# Patient Record
Sex: Female | Born: 1970
Health system: Southern US, Community
[De-identification: ages and names within clinical notes are randomized; demographics above are authoritative.]

## PROBLEM LIST (undated history)

## (undated) DIAGNOSIS — E049 Nontoxic goiter, unspecified: Secondary | ICD-10-CM

## (undated) DIAGNOSIS — I10 Essential (primary) hypertension: Secondary | ICD-10-CM

## (undated) HISTORY — DX: Essential (primary) hypertension: I10

## (undated) HISTORY — DX: Nontoxic goiter, unspecified: E04.9

## (undated) HISTORY — PX: NO PAST SURGERIES: SHX2092

---

## 2009-01-19 ENCOUNTER — Encounter: Admission: RE | Admit: 2009-01-19 | Discharge: 2009-01-19 | Payer: Self-pay | Admitting: Critical Care Medicine

## 2009-02-04 ENCOUNTER — Inpatient Hospital Stay (HOSPITAL_COMMUNITY): Admission: AD | Admit: 2009-02-04 | Discharge: 2009-02-07 | Payer: Self-pay | Admitting: Obstetrics and Gynecology

## 2010-09-24 LAB — CBC
HCT: 29.1 % — ABNORMAL LOW (ref 36.0–46.0)
Hemoglobin: 8.4 g/dL — ABNORMAL LOW (ref 12.0–15.0)
Hemoglobin: 9.6 g/dL — ABNORMAL LOW (ref 12.0–15.0)
Hemoglobin: 9.9 g/dL — ABNORMAL LOW (ref 12.0–15.0)
MCHC: 33.1 g/dL (ref 30.0–36.0)
MCHC: 33.2 g/dL (ref 30.0–36.0)
MCHC: 33.2 g/dL (ref 30.0–36.0)
MCV: 79.3 fL (ref 78.0–100.0)
Platelets: 265 10*3/uL (ref 150–400)
RBC: 3.19 MIL/uL — ABNORMAL LOW (ref 3.87–5.11)
RDW: 19.2 % — ABNORMAL HIGH (ref 11.5–15.5)
RDW: 19.4 % — ABNORMAL HIGH (ref 11.5–15.5)
WBC: 10.5 10*3/uL (ref 4.0–10.5)
WBC: 9 10*3/uL (ref 4.0–10.5)

## 2010-09-24 LAB — COMPREHENSIVE METABOLIC PANEL
ALT: 13 U/L (ref 0–35)
ALT: 15 U/L (ref 0–35)
Albumin: 2.5 g/dL — ABNORMAL LOW (ref 3.5–5.2)
Alkaline Phosphatase: 83 U/L (ref 39–117)
Alkaline Phosphatase: 96 U/L (ref 39–117)
BUN: 8 mg/dL (ref 6–23)
CO2: 24 mEq/L (ref 19–32)
Calcium: 8.7 mg/dL (ref 8.4–10.5)
Chloride: 106 mEq/L (ref 96–112)
Creatinine, Ser: 0.67 mg/dL (ref 0.4–1.2)
Creatinine, Ser: 0.71 mg/dL (ref 0.4–1.2)
GFR calc non Af Amer: >60 mL/min (ref >60)
Glucose, Bld: 74 mg/dL (ref 70–99)
Glucose, Bld: 90 mg/dL (ref 70–99)
Glucose, Bld: 96 mg/dL (ref 70–99)
Potassium: 3.6 mEq/L (ref 3.5–5.1)
Potassium: 3.7 mEq/L (ref 3.5–5.1)
Sodium: 135 mEq/L (ref 135–145)
Sodium: 136 mEq/L (ref 135–145)
Total Bilirubin: 0.1 mg/dL — ABNORMAL LOW (ref 0.3–1.2)
Total Protein: 6.4 g/dL (ref 6.0–8.3)
Total Protein: 6.7 g/dL (ref 6.0–8.3)

## 2010-09-24 LAB — URIC ACID
Uric Acid, Serum: 6.1 mg/dL (ref 2.4–7.0)
Uric Acid, Serum: 6.4 mg/dL (ref 2.4–7.0)

## 2010-09-24 LAB — LACTATE DEHYDROGENASE: LDH: 101 U/L (ref 94–250)

## 2010-09-24 LAB — MAGNESIUM: Magnesium: 4.9 mg/dL — ABNORMAL HIGH (ref 1.5–2.5)

## 2011-09-26 ENCOUNTER — Other Ambulatory Visit: Payer: Self-pay | Admitting: Family Medicine

## 2011-09-26 DIAGNOSIS — E049 Nontoxic goiter, unspecified: Secondary | ICD-10-CM

## 2011-10-04 ENCOUNTER — Ambulatory Visit
Admission: RE | Admit: 2011-10-04 | Discharge: 2011-10-04 | Disposition: A | Discharge status: Home / Self Care | Payer: BC Managed Care – PPO | Source: Ambulatory Visit | Attending: Family Medicine | Admitting: Family Medicine

## 2011-10-04 DIAGNOSIS — E049 Nontoxic goiter, unspecified: Secondary | ICD-10-CM

## 2013-01-02 ENCOUNTER — Other Ambulatory Visit: Payer: Self-pay | Admitting: Family Medicine

## 2013-01-02 DIAGNOSIS — E079 Disorder of thyroid, unspecified: Secondary | ICD-10-CM

## 2013-01-03 ENCOUNTER — Ambulatory Visit
Admission: RE | Admit: 2013-01-03 | Discharge: 2013-01-03 | Disposition: A | Discharge status: Home / Self Care | Payer: BC Managed Care – PPO | Source: Ambulatory Visit | Attending: Family Medicine | Admitting: Family Medicine

## 2013-01-03 DIAGNOSIS — E079 Disorder of thyroid, unspecified: Secondary | ICD-10-CM

## 2013-01-28 ENCOUNTER — Ambulatory Visit (INDEPENDENT_AMBULATORY_CARE_PROVIDER_SITE_OTHER): Payer: BC Managed Care – PPO | Admitting: Surgery

## 2013-01-28 ENCOUNTER — Encounter (INDEPENDENT_AMBULATORY_CARE_PROVIDER_SITE_OTHER): Payer: Self-pay | Admitting: Surgery

## 2013-01-28 VITALS — BP 122/78 | HR 72 | Resp 14 | Ht 66.0 in | Wt 219.0 lb

## 2013-01-28 DIAGNOSIS — E042 Nontoxic multinodular goiter: Secondary | ICD-10-CM | POA: Insufficient documentation

## 2013-01-28 DIAGNOSIS — D449 Neoplasm of uncertain behavior of unspecified endocrine gland: Secondary | ICD-10-CM

## 2013-01-28 DIAGNOSIS — D440 Neoplasm of uncertain behavior of thyroid gland: Secondary | ICD-10-CM | POA: Insufficient documentation

## 2013-01-28 NOTE — Progress Notes (Signed)
General Surgery [facility]  Chief Complaint  Patient presents with  . New Evaluation    thyroid nodules - referral from Dr. Vianne Bulls    HISTORY: The patient is a 42 year old female referred by her primary care physician for evaluation of bilateral thyroid nodules. Patient was diagnosed with multinodular goiter one year ago. She underwent ultrasound in April of 2013. On physical examination this year she was again noted to have prominent bilateral thyroid nodules. Ultrasound performed on 01/03/2013 is reviewed with the patient. This shows multiple thyroid nodules arising in a small multinodular goiter. Dominant nodule on the right measures 17 mm. Dominant nodule in the left measures 20 mm and has microcalcifications. Patient is referred to surgery for further evaluation and recommendations for management.  Patient's TSH level was normal at 1.45.  Patient has no prior history of head or neck surgery. She has never been on thyroid medication. There is a history of Graves' disease and the patient's sister. Patient's mother is being treated for a laryngeal neoplasm.  Past Medical History  Diagnosis Date  . Hypertension     Current Outpatient Prescriptions  Medication Sig Dispense Refill  . amLODipine (NORVASC) 5 MG tablet       . b complex vitamins tablet Take 1 tablet by mouth daily.      . hydrochlorothiazide (HYDRODIURIL) 25 MG tablet       . Multiple Vitamins-Minerals (MULTIVITAMIN WITH MINERALS) tablet Take 1 tablet by mouth daily.       No current facility-administered medications for this visit.    No Known Allergies  History reviewed. No pertinent family history.  History   Social History  . Marital Status: Single    Spouse Name: N/A    Number of Children: N/A  . Years of Education: N/A   Social History Main Topics  . Smoking status: Never Smoker   . Smokeless tobacco: Never Used  . Alcohol Use: Yes  . Drug Use: No  . Sexually Active: None    Other Topics Concern  . None   Social History Narrative  . None    REVIEW OF SYSTEMS - PERTINENT POSITIVES ONLY: Denies fatigue. Denies tremor. Denies palpitations. Denies compressive symptoms. Some success with weight loss with exercise.  EXAM: Filed Vitals:   01/28/13 0859  BP: 122/78  Pulse: 72  Resp: 14    HEENT: normocephalic; pupils equal and reactive; sclerae clear; dentition good; mucous membranes moist NECK:  Palpable bilateral thyroid nodules with dominant mass in right inferior pole measuring approximately 2 cm, firm, mobile, nontender; asymmetric on extension; no palpable anterior or posterior cervical lymphadenopathy; no supraclavicular masses; no tenderness CHEST: clear to auscultation bilaterally without rales, rhonchi, or wheezes CARDIAC: regular rate and rhythm without significant murmur; peripheral pulses are full EXT:  non-tender without edema; no deformity NEURO: no gross focal deficits; no sign of tremor   LABORATORY RESULTS: See [facility] (CHL-Epic) for most recent results  RADIOLOGY RESULTS: See [facility] (CHL-Epic) for most recent results  IMPRESSION: Multinodular thyroid goiter with bilateral dominant thyroid nodules  PLAN: Patient requires bilateral fine-needle aspiration biopsies. We will make arrangements for the study to be performed in the near future. I will contact her with the results.  If the cytopathology is benign, I have recommended that the patient return in 6 months for short interval followup with repeat ultrasound and physical examination. If the cytopathology shows atypia or possible malignancy, then I believe the patient will require total thyroidectomy.  She and I discussed these potential options today. I provided her with written literature to review. We will contact her with her biopsy results.  Velora Heckler, MD, FACS General & Endocrine Surgery [facility]  Primary Care  Physician: Daisy Floro, MD

## 2013-01-28 NOTE — Patient Instructions (Signed)
Thyroid Diseases  Your thyroid is a butterfly-shaped gland in your neck. It is located just above your collarbone. It is one of your endocrine glands, which make hormones. The thyroid helps set your metabolism. Metabolism is how your body gets energy from the foods you eat.   Millions of people have thyroid diseases. Women experience thyroid problems more often than men. In fact, overactive thyroid problems (hyperthyroidism) occur in 1% of all women. If you have a thyroid disease, your body may use energy more slowly or quickly than it should.   Thyroid problems also include an immune disease where your body reacts against your thyroid gland (called thyroiditis). A different problem involves lumps and bumps (called nodules) that develop in the gland. The nodules are usually, but not always, noncancerous.  THE MOST COMMON THYROID PROBLEMS AND CAUSES ARE DISCUSSED BELOW  There are many causes for thyroid problems. Treatment depends upon the exact diagnosis and includes trying to reset your body's metabolism to a normal rate.  Hyperthyroidism  Too much thyroid hormone from an overactive thyroid gland is called hyperthyroidism. In hyperthyroidism, the body's metabolism speeds up. One of the most frequent forms of hyperthyroidism is known as Graves' disease. Graves' disease tends to run in families. Although Graves' is thought to be caused by a problem with the immune system, the exact nature of the genetic problem is unknown.  Hypothyroidism  Too little thyroid hormone from an underactive thyroid gland is called hypothyroidism. In hypothyroidism, the body's metabolism is slowed. Several things can cause this condition. Most causes affect the thyroid gland directly and hurt its ability to make enough hormone.   Rarely, there may be a pituitary gland tumor (located near the base of the brain). The tumor can block the pituitary from producing thyroid-stimulating hormone (TSH). Your body makes TSH to stimulate the thyroid  to work properly. If the pituitary does not make enough TSH, the thyroid fails to make enough hormones needed for good health.  Whether the problem is caused by thyroid conditions or by the pituitary gland, the result is that the thyroid is not making enough hormones. Hypothyroidism causes many physical and mental processes to become sluggish. The body consumes less oxygen and produces less body heat.  Thyroid Nodules  A thyroid nodule is a small swelling or lump in the thyroid gland. They are common. These nodules represent either a growth of thyroid tissue or a fluid-filled cyst. Both form a lump in the thyroid gland. Almost half of all people will have tiny thyroid nodules at some point in their lives. Typically, these are not noticeable until they become large and affect normal thyroid size. Larger nodules that are greater than a half inch across (about 1 centimeter) occur in about 5 percent of people.  Although most nodules are not cancerous, people who have them should seek medical care to rule out cancer. Also, some thyroid nodules may produce too much thyroid hormone or become too large. Large nodules or a large gland can interfere with breathing or swallowing or may cause neck discomfort.  Other problems  Other thyroid problems include cancer and thyroiditis. Thyroiditis is a malfunction of the body's immune system. Normally, the immune system works to defend the body against infection and other problems. When the immune system is not working properly, it may mistakenly attack normal cells, tissues, and organs. Examples of autoimmune diseases are Hashimoto's thyroiditis (which causes low thyroid function) and Graves' disease (which causes excess thyroid function).  SYMPTOMS   Symptoms   vary greatly depending upon the exact type of problem with the thyroid.  Hyperthyroidism-is when your thyroid is too active and makes more thyroid hormone than your body needs. The most common cause is Graves' Disease. Too  much thyroid hormone can cause some or all of the following symptoms:  · Anxiety.  · Irritability.  · Difficulty sleeping.  · Fatigue.  · A rapid or irregular heartbeat.  · A fine tremor of your hands or fingers.  · An increase in perspiration.  · Sensitivity to heat.  · Weight loss, despite normal food intake.  · Brittle hair.  · Enlargement of your thyroid gland (goiter).  · Light menstrual periods.  · Frequent bowel movements.  Graves' disease can specifically cause eye and skin problems. The skin problems involve reddening and swelling of the skin, often on your shins and on the top of your feet. Eye problems can include the following:  · Excess tearing and sensation of grit or sand in either or both eyes.  · Reddened or inflamed eyes.  · Widening of the space between your eyelids.  · Swelling of the lids and tissues around the eyes.  · Light sensitivity.  · Ulcers on the cornea.  · Double vision.  · Limited eye movements.  · Blurred or reduced vision.  Hypothyroidism- is when your thyroid gland is not active enough. This is more common than hyperthyroidism. Symptoms can vary a lot depending of the severity of the hormone deficiency. Symptoms may develop over a long period of time and can include several of the following:  · Fatigue.  · Sluggishness.  · Increased sensitivity to cold.  · Constipation.  · Pale, dry skin.  · A puffy face.  · Hoarse voice.  · High blood cholesterol level.  · Unexplained weight gain.  · Muscle aches, tenderness and stiffness.  · Pain, stiffness or swelling in your joints.  · Muscle weakness.  · Heavier than normal menstrual periods.  · Brittle fingernails and hair.  · Depression.  Thyroid Nodules - most do not cause signs or symptoms. Occasionally, some may become so large that you can feel or even see the swelling at the base of your neck. You may realize a lump or swelling is there when you are shaving or putting on makeup. Men might become aware of a nodule when shirt collars  suddenly feel too tight.  Some nodules produce too much thyroid hormone. This can produce the same symptoms as hyperthyroidism (see above).  Thyroid nodules are seldom cancerous. However, a nodule is more likely to be malignant (cancerous) if it:  · Grows quickly or feels hard.  · Causes you to become hoarse or to have trouble swallowing or breathing.  · Causes enlarged lymph nodes under your jaw or in your neck.  DIAGNOSIS   Because there are so many possible thyroid conditions, your caregiver may ask for a number of tests. They will do this in order to narrow down the exact diagnosis. These tests can include:  · Blood and antibody tests.  · Special thyroid scans using small, safe amounts of radioactive iodine.  · Ultrasound of the thyroid gland (particularly if there is a nodule or lump).  · Biopsy. This is usually done with a special needle. A needle biopsy is a procedure to obtain a sample of cells from the thyroid. The tissue will be tested in a lab and examined under a microscope.  TREATMENT   Treatment depends on the exact diagnosis.    Hyperthyroidism  · Beta-blockers help relieve many of the symptoms.  · Anti-thyroid medications prevent the thyroid from making excess hormones.  · Radioactive iodine treatment can destroy overactive thyroid cells. The iodine can permanently decrease the amount of hormone produced.  · Surgery to remove the thyroid gland.  · Treatments for eye problems that come from Graves' disease also include medications and special eye surgery, if felt to be appropriate.  Hypothyroidism  Thyroid replacement with levothyroxine is the mainstay of treatment. Treatment with thyroid replacement is usually lifelong and will require monitoring and adjustment from time to time.  Thyroid Nodules  · Watchful waiting. If a small nodule causes no symptoms or signs of cancer on biopsy, then no treatment may be chosen at first. Re-exam and re-checking blood tests would be the recommended  follow-up.  · Anti-thyroid medications or radioactive iodine treatment may be recommended if the nodules produce too much thyroid hormone (see Treatment for Hyperthyroidism above).  · Alcohol ablation. Injections of small amounts of ethyl alcohol (ethanol) can cause a non-cancerous nodule to shrink in size.  · Surgery (see Treatment for Hyperthyroidism above).  HOME CARE INSTRUCTIONS   · Take medications as instructed.  · Follow through on recommended testing.  SEEK MEDICAL CARE IF:   · You feel that you are developing symptoms of Hyperthyroidism or Hypothyroidism as described above.  · You develop a new lump/nodule in the neck/thyroid area that you had not noticed before.  · You feel that you are having side effects from medicines prescribed.  · You develop trouble breathing or swallowing.  SEEK IMMEDIATE MEDICAL CARE IF:   · You develop a fever of 102° F (38.9° C) or higher.  · You develop severe sweating.  · You develop palpitations and/or rapid heart beat.  · You develop shortness of breath.  · You develop nausea and vomiting.  · You develop extreme shakiness.  · You develop agitation.  · You develop lightheadedness or have a fainting episode.  Document Released: 04/02/2007 Document Revised: 08/28/2011 Document Reviewed: 04/02/2007  ExitCare® Patient Information ©2014 [employer].

## 2013-02-04 ENCOUNTER — Ambulatory Visit
Admission: RE | Admit: 2013-02-04 | Discharge: 2013-02-04 | Disposition: A | Discharge status: Home / Self Care | Payer: BC Managed Care – PPO | Source: Ambulatory Visit | Attending: Surgery | Admitting: Surgery

## 2013-02-04 ENCOUNTER — Other Ambulatory Visit (HOSPITAL_COMMUNITY)
Admission: RE | Admit: 2013-02-04 | Discharge: 2013-02-04 | Disposition: A | Discharge status: Home / Self Care | Payer: BC Managed Care – PPO | Source: Ambulatory Visit | Attending: Interventional Radiology | Admitting: Interventional Radiology

## 2013-02-04 DIAGNOSIS — D440 Neoplasm of uncertain behavior of thyroid gland: Secondary | ICD-10-CM

## 2013-02-04 DIAGNOSIS — E041 Nontoxic single thyroid nodule: Secondary | ICD-10-CM | POA: Insufficient documentation

## 2013-02-04 DIAGNOSIS — E042 Nontoxic multinodular goiter: Secondary | ICD-10-CM

## 2013-02-10 ENCOUNTER — Telehealth (INDEPENDENT_AMBULATORY_CARE_PROVIDER_SITE_OTHER): Payer: Self-pay

## 2013-02-10 NOTE — Telephone Encounter (Signed)
Pathology to Dr Gerrit Friends for review and to advise f/u.

## 2013-02-12 ENCOUNTER — Telehealth (INDEPENDENT_AMBULATORY_CARE_PROVIDER_SITE_OTHER): Payer: Self-pay | Admitting: Surgery

## 2013-02-12 DIAGNOSIS — D440 Neoplasm of uncertain behavior of thyroid gland: Secondary | ICD-10-CM

## 2013-02-12 NOTE — Telephone Encounter (Signed)
Discussed FNA biopsy results with patient.  Cytopathology shows significant atypia in left thyroid nodule (2.0 cm).  I recommend total thyroidectomy for definitive diagnosis.  Patient does not want surgery and does not want to take medicine.  She wishes to discuss this with Dr. Tenny Craw.  I offered referral for second opinion to endocrinology or another endocrine surgeon (Dr. Duanne Guess at [facility]).  Patient will contact us about how she would like to proceed.  Velora Heckler, MD, [facility] Office: [phone]

## 2013-02-12 NOTE — Telephone Encounter (Signed)
Left message to call me at office to discuss cytopathology results.  Will likely require total thyroidectomy for definitive diagnosis.  Velora Heckler, MD, [facility] Office: [phone]

## 2013-03-24 ENCOUNTER — Other Ambulatory Visit (INDEPENDENT_AMBULATORY_CARE_PROVIDER_SITE_OTHER): Payer: Self-pay | Admitting: Surgery

## 2013-04-03 ENCOUNTER — Telehealth (INDEPENDENT_AMBULATORY_CARE_PROVIDER_SITE_OTHER): Payer: Self-pay | Admitting: Surgery

## 2013-04-03 NOTE — Telephone Encounter (Signed)
Messages left for patient regarding getting surgery scheduled. Face sheet has been placed in pending.

## 2013-06-02 ENCOUNTER — Other Ambulatory Visit (HOSPITAL_COMMUNITY): Payer: Self-pay | Admitting: Surgery

## 2013-06-02 ENCOUNTER — Encounter (HOSPITAL_COMMUNITY): Payer: Self-pay | Admitting: Pharmacy Technician

## 2013-06-02 NOTE — Patient Instructions (Addendum)
20 [name]  06/02/2013   Your procedure is scheduled on: Friday December 19TH  Report to [facility] at 630 AM.  Call this number if you have problems the morning of surgery [phone]   Remember:   Do not eat food or drink liquids :After Midnight.     Take these medicines the morning of surgery with A SIP OF WATER: Norvasc                                SEE [facility] PREPARING FOR SURGERY SHEET             You may not have any metal on your body including hair pins and piercings  Do not wear jewelry, make-up.  Do not wear lotions, powders, or perfumes. You may wear deodorant.   Men may shave face and neck.  Do not bring valuables to the hospital. [facility] IS NOT RESPONSIBLE FOR VALUEABLES.  Contacts, dentures or bridgework may not be worn into surgery.  Leave suitcase in the car. After surgery it may be brought to your room.  For patients admitted to the hospital, checkout time is 11:00 AM the day of discharge.   Patients discharged the day of surgery will not be allowed to drive home.  Name and phone number of your driver:  Special Instructions: N/A   Please read over the following fact sheets that you were given:   Call Cain Sieve RN pre op nurse if needed [phone]    FAILURE TO FOLLOW THESE INSTRUCTIONS MAY RESULT IN THE CANCELLATION OF YOUR SURGERY.  PATIENT SIGNATURE___________________________________________  NURSE SIGNATURE_____________________________________________

## 2013-06-04 ENCOUNTER — Ambulatory Visit (HOSPITAL_COMMUNITY)
Admission: RE | Admit: 2013-06-04 | Discharge: 2013-06-04 | Disposition: A | Discharge status: Home / Self Care | Payer: BC Managed Care – PPO | Source: Ambulatory Visit | Attending: Surgery | Admitting: Surgery

## 2013-06-04 ENCOUNTER — Encounter (HOSPITAL_COMMUNITY)
Admission: RE | Admit: 2013-06-04 | Discharge: 2013-06-04 | Disposition: A | Discharge status: Home / Self Care | Payer: BC Managed Care – PPO | Source: Ambulatory Visit | Attending: Surgery | Admitting: Surgery

## 2013-06-04 ENCOUNTER — Encounter (HOSPITAL_COMMUNITY): Payer: Self-pay

## 2013-06-04 DIAGNOSIS — Z01818 Encounter for other preprocedural examination: Secondary | ICD-10-CM | POA: Insufficient documentation

## 2013-06-04 DIAGNOSIS — Z01810 Encounter for preprocedural cardiovascular examination: Secondary | ICD-10-CM | POA: Insufficient documentation

## 2013-06-04 DIAGNOSIS — E042 Nontoxic multinodular goiter: Secondary | ICD-10-CM | POA: Insufficient documentation

## 2013-06-04 DIAGNOSIS — Z01812 Encounter for preprocedural laboratory examination: Secondary | ICD-10-CM | POA: Insufficient documentation

## 2013-06-04 DIAGNOSIS — I10 Essential (primary) hypertension: Secondary | ICD-10-CM | POA: Insufficient documentation

## 2013-06-04 LAB — BASIC METABOLIC PANEL
BUN: 15 mg/dL (ref 6–23)
CO2: 28 mEq/L (ref 19–32)
Calcium: 9.5 mg/dL (ref 8.4–10.5)
Chloride: 102 mEq/L (ref 96–112)
Glucose, Bld: 93 mg/dL (ref 70–99)
Sodium: 137 mEq/L (ref 135–145)

## 2013-06-04 LAB — CBC
HCT: 36.2 % (ref 36.0–46.0)
MCH: 26 pg (ref 26.0–34.0)
MCV: 77.7 fL — ABNORMAL LOW (ref 78.0–100.0)
Platelets: 311 10*3/uL (ref 150–400)
RBC: 4.66 MIL/uL (ref 3.87–5.11)
WBC: 5.8 10*3/uL (ref 4.0–10.5)

## 2013-06-04 NOTE — Progress Notes (Signed)
Quick Note:  These results are acceptable for scheduled surgery.  Makayia Duplessis M. Janiyah Beery, MD, FACS [facility] Office: [phone]   ______ 

## 2013-06-05 ENCOUNTER — Other Ambulatory Visit (INDEPENDENT_AMBULATORY_CARE_PROVIDER_SITE_OTHER): Payer: Self-pay

## 2013-06-05 ENCOUNTER — Telehealth (INDEPENDENT_AMBULATORY_CARE_PROVIDER_SITE_OTHER): Payer: Self-pay

## 2013-06-05 NOTE — Telephone Encounter (Signed)
Lab slip mailed to pt for po calcium level.

## 2013-06-06 ENCOUNTER — Encounter (HOSPITAL_COMMUNITY): Payer: BC Managed Care – PPO | Admitting: Anesthesiology

## 2013-06-06 ENCOUNTER — Observation Stay (HOSPITAL_COMMUNITY)
Admission: RE | Admit: 2013-06-06 | Discharge: 2013-06-07 | Disposition: A | Discharge status: Home / Self Care | Payer: BC Managed Care – PPO | Source: Ambulatory Visit | Attending: Surgery | Admitting: Surgery

## 2013-06-06 ENCOUNTER — Ambulatory Visit (HOSPITAL_COMMUNITY): Payer: BC Managed Care – PPO | Admitting: Anesthesiology

## 2013-06-06 ENCOUNTER — Encounter (HOSPITAL_COMMUNITY): Payer: Self-pay | Admitting: *Deleted

## 2013-06-06 ENCOUNTER — Encounter (HOSPITAL_COMMUNITY)
Admission: RE | Disposition: A | Discharge status: Home / Self Care | Payer: Self-pay | Source: Ambulatory Visit | Attending: Surgery

## 2013-06-06 DIAGNOSIS — D440 Neoplasm of uncertain behavior of thyroid gland: Secondary | ICD-10-CM | POA: Diagnosis present

## 2013-06-06 DIAGNOSIS — I10 Essential (primary) hypertension: Secondary | ICD-10-CM | POA: Insufficient documentation

## 2013-06-06 DIAGNOSIS — E042 Nontoxic multinodular goiter: Principal | ICD-10-CM

## 2013-06-06 DIAGNOSIS — E041 Nontoxic single thyroid nodule: Secondary | ICD-10-CM | POA: Diagnosis present

## 2013-06-06 HISTORY — PX: THYROIDECTOMY: SHX17

## 2013-06-06 SURGERY — THYROIDECTOMY
Anesthesia: General | Site: Neck

## 2013-06-06 MED ORDER — GLYCOPYRROLATE 0.2 MG/ML IJ SOLN
INTRAMUSCULAR | Status: DC | PRN
  Administered 2013-06-06: .6 mg via INTRAVENOUS

## 2013-06-06 MED ORDER — METOCLOPRAMIDE HCL 5 MG/ML IJ SOLN
INTRAMUSCULAR | Status: DC | PRN
  Administered 2013-06-06: 10 mg via INTRAVENOUS

## 2013-06-06 MED ORDER — HYDROCODONE-ACETAMINOPHEN 5-325 MG PO TABS
1.0000 | ORAL_TABLET | ORAL | Status: DC | PRN
  Administered 2013-06-06 – 2013-06-07 (×2): 2 via ORAL
  Filled 2013-06-06 (×2): qty 2

## 2013-06-06 MED ORDER — LIDOCAINE HCL (CARDIAC) 20 MG/ML IV SOLN
INTRAVENOUS | Status: AC
  Filled 2013-06-06: qty 5

## 2013-06-06 MED ORDER — NEOSTIGMINE METHYLSULFATE 1 MG/ML IJ SOLN
INTRAMUSCULAR | Status: AC
  Filled 2013-06-06: qty 10

## 2013-06-06 MED ORDER — MIDAZOLAM HCL 5 MG/5ML IJ SOLN
INTRAMUSCULAR | Status: DC | PRN
  Administered 2013-06-06: 2 mg via INTRAVENOUS

## 2013-06-06 MED ORDER — ONDANSETRON HCL 4 MG PO TABS: 4.0000 mg | ORAL_TABLET | Freq: Four times a day (QID) | ORAL | Status: DC | PRN

## 2013-06-06 MED ORDER — ONDANSETRON HCL 4 MG/2ML IJ SOLN
INTRAMUSCULAR | Status: AC
  Filled 2013-06-06: qty 2

## 2013-06-06 MED ORDER — FENTANYL CITRATE 0.05 MG/ML IJ SOLN
INTRAMUSCULAR | Status: AC
  Filled 2013-06-06: qty 2

## 2013-06-06 MED ORDER — HYDROMORPHONE HCL PF 1 MG/ML IJ SOLN
INTRAMUSCULAR | Status: AC
  Filled 2013-06-06: qty 1

## 2013-06-06 MED ORDER — LIDOCAINE HCL (CARDIAC) 20 MG/ML IV SOLN
INTRAVENOUS | Status: DC | PRN
  Administered 2013-06-06: 25 mg via INTRAVENOUS
  Administered 2013-06-06: 100 mg via INTRAVENOUS

## 2013-06-06 MED ORDER — DEXAMETHASONE SODIUM PHOSPHATE 10 MG/ML IJ SOLN
INTRAMUSCULAR | Status: AC
  Filled 2013-06-06: qty 1

## 2013-06-06 MED ORDER — INFLUENZA VAC SPLIT QUAD 0.5 ML IM SUSP
0.5000 mL | INTRAMUSCULAR | Status: AC
  Administered 2013-06-07: 0.5 mL via INTRAMUSCULAR
  Filled 2013-06-06 (×2): qty 0.5

## 2013-06-06 MED ORDER — ONDANSETRON HCL 4 MG/2ML IJ SOLN: 4.0000 mg | Freq: Four times a day (QID) | INTRAMUSCULAR | Status: DC | PRN

## 2013-06-06 MED ORDER — CALCIUM CARBONATE 1250 (500 CA) MG PO TABS
2.0000 | ORAL_TABLET | Freq: Three times a day (TID) | ORAL | Status: DC
  Administered 2013-06-06 – 2013-06-07 (×2): 1000 mg via ORAL
  Filled 2013-06-06 (×6): qty 2

## 2013-06-06 MED ORDER — CEFAZOLIN SODIUM-DEXTROSE 2-3 GM-% IV SOLR
INTRAVENOUS | Status: AC
  Filled 2013-06-06: qty 50

## 2013-06-06 MED ORDER — MIDAZOLAM HCL 2 MG/2ML IJ SOLN
INTRAMUSCULAR | Status: AC
  Filled 2013-06-06: qty 2

## 2013-06-06 MED ORDER — CEFAZOLIN SODIUM-DEXTROSE 2-3 GM-% IV SOLR
2.0000 g | INTRAVENOUS | Status: AC
  Administered 2013-06-06: 2 g via INTRAVENOUS

## 2013-06-06 MED ORDER — ACETAMINOPHEN 325 MG PO TABS: 650.0000 mg | ORAL_TABLET | ORAL | Status: DC | PRN

## 2013-06-06 MED ORDER — LACTATED RINGERS IV SOLN: INTRAVENOUS | Status: DC

## 2013-06-06 MED ORDER — DEXAMETHASONE SODIUM PHOSPHATE 10 MG/ML IJ SOLN
INTRAMUSCULAR | Status: DC | PRN
  Administered 2013-06-06: 5 mg via INTRAVENOUS

## 2013-06-06 MED ORDER — LEVOTHYROXINE SODIUM 100 MCG PO TABS
100.0000 ug | ORAL_TABLET | Freq: Every day | ORAL | Status: DC
  Administered 2013-06-07: 100 ug via ORAL
  Filled 2013-06-06 (×3): qty 1

## 2013-06-06 MED ORDER — LACTATED RINGERS IV SOLN
INTRAVENOUS | Status: DC
  Administered 2013-06-06: 1000 mL via INTRAVENOUS
  Administered 2013-06-06: 10:00:00 via INTRAVENOUS

## 2013-06-06 MED ORDER — AMLODIPINE BESYLATE 5 MG PO TABS
5.0000 mg | ORAL_TABLET | Freq: Every morning | ORAL | Status: DC
  Administered 2013-06-06 – 2013-06-07 (×2): 5 mg via ORAL
  Filled 2013-06-06 (×2): qty 1

## 2013-06-06 MED ORDER — PROPOFOL 10 MG/ML IV BOLUS
INTRAVENOUS | Status: AC
  Filled 2013-06-06: qty 20

## 2013-06-06 MED ORDER — ROCURONIUM BROMIDE 100 MG/10ML IV SOLN
INTRAVENOUS | Status: DC | PRN
  Administered 2013-06-06: 10 mg via INTRAVENOUS
  Administered 2013-06-06: 40 mg via INTRAVENOUS
  Administered 2013-06-06: 15 mg via INTRAVENOUS

## 2013-06-06 MED ORDER — GLYCOPYRROLATE 0.2 MG/ML IJ SOLN
INTRAMUSCULAR | Status: AC
  Filled 2013-06-06: qty 3

## 2013-06-06 MED ORDER — ROCURONIUM BROMIDE 100 MG/10ML IV SOLN
INTRAVENOUS | Status: AC
  Filled 2013-06-06: qty 1

## 2013-06-06 MED ORDER — 0.9 % SODIUM CHLORIDE (POUR BTL) OPTIME
TOPICAL | Status: DC | PRN
  Administered 2013-06-06: 1000 mL

## 2013-06-06 MED ORDER — METOCLOPRAMIDE HCL 5 MG/ML IJ SOLN
INTRAMUSCULAR | Status: AC
  Filled 2013-06-06: qty 2

## 2013-06-06 MED ORDER — FENTANYL CITRATE 0.05 MG/ML IJ SOLN
INTRAMUSCULAR | Status: AC
  Filled 2013-06-06: qty 5

## 2013-06-06 MED ORDER — HYDROCHLOROTHIAZIDE 25 MG PO TABS
25.0000 mg | ORAL_TABLET | Freq: Every morning | ORAL | Status: DC
  Administered 2013-06-06 – 2013-06-07 (×2): 25 mg via ORAL
  Filled 2013-06-06 (×2): qty 1

## 2013-06-06 MED ORDER — KCL IN DEXTROSE-NACL 20-5-0.45 MEQ/L-%-% IV SOLN
INTRAVENOUS | Status: DC
  Administered 2013-06-06: 14:00:00 via INTRAVENOUS
  Filled 2013-06-06 (×2): qty 1000

## 2013-06-06 MED ORDER — HYDROMORPHONE HCL PF 1 MG/ML IJ SOLN
1.0000 mg | INTRAMUSCULAR | Status: DC | PRN
  Administered 2013-06-06: 1 mg via INTRAVENOUS
  Filled 2013-06-06: qty 1

## 2013-06-06 MED ORDER — NEOSTIGMINE METHYLSULFATE 1 MG/ML IJ SOLN
INTRAMUSCULAR | Status: DC | PRN
  Administered 2013-06-06: 4 mg via INTRAVENOUS

## 2013-06-06 MED ORDER — HYDROMORPHONE HCL PF 1 MG/ML IJ SOLN
0.2500 mg | INTRAMUSCULAR | Status: DC | PRN
  Administered 2013-06-06 (×3): 0.5 mg via INTRAVENOUS

## 2013-06-06 MED ORDER — PROPOFOL 10 MG/ML IV BOLUS
INTRAVENOUS | Status: DC | PRN
  Administered 2013-06-06: 200 mg via INTRAVENOUS

## 2013-06-06 MED ORDER — FENTANYL CITRATE 0.05 MG/ML IJ SOLN
INTRAMUSCULAR | Status: DC | PRN
  Administered 2013-06-06 (×6): 50 ug via INTRAVENOUS
  Administered 2013-06-06: 100 ug via INTRAVENOUS
  Administered 2013-06-06 (×2): 50 ug via INTRAVENOUS
  Administered 2013-06-06: 100 ug via INTRAVENOUS

## 2013-06-06 MED ORDER — ONDANSETRON HCL 4 MG/2ML IJ SOLN
INTRAMUSCULAR | Status: DC | PRN
  Administered 2013-06-06: 4 mg via INTRAVENOUS

## 2013-06-06 SURGICAL SUPPLY — 38 items
ATTRACTOMAT 16X20 MAGNETIC DRP (DRAPES) ×2 IMPLANT
BENZOIN TINCTURE PRP APPL 2/3 (GAUZE/BANDAGES/DRESSINGS) ×2 IMPLANT
BLADE HEX COATED 2.75 (ELECTRODE) ×2 IMPLANT
BLADE SURG 15 STRL LF DISP TIS (BLADE) ×1 IMPLANT
BLADE SURG 15 STRL SS (BLADE) ×1
CANISTER SUCTION 2500CC (MISCELLANEOUS) ×2 IMPLANT
CHLORAPREP W/TINT 26ML (MISCELLANEOUS) ×2 IMPLANT
CLIP TI MEDIUM 6 (CLIP) ×6 IMPLANT
CLIP TI WIDE RED SMALL 6 (CLIP) ×8 IMPLANT
DISSECTOR ROUND CHERRY 3/8 STR (MISCELLANEOUS) IMPLANT
DRAPE PED LAPAROTOMY (DRAPES) ×2 IMPLANT
DRESSING SURGICEL FIBRLLR 1X2 (HEMOSTASIS) ×1 IMPLANT
DRSG SURGICEL FIBRILLAR 1X2 (HEMOSTASIS) ×2
ELECT REM PT RETURN 9FT ADLT (ELECTROSURGICAL) ×2
ELECTRODE REM PT RTRN 9FT ADLT (ELECTROSURGICAL) ×1 IMPLANT
GAUZE SPONGE 4X4 16PLY XRAY LF (GAUZE/BANDAGES/DRESSINGS) ×2 IMPLANT
GLOVE SURG ORTHO 8.0 STRL STRW (GLOVE) ×2 IMPLANT
GOWN STRL REIN XL XLG (GOWN DISPOSABLE) ×4 IMPLANT
KIT BASIN OR (CUSTOM PROCEDURE TRAY) ×2 IMPLANT
NS IRRIG 1000ML POUR BTL (IV SOLUTION) IMPLANT
PACK BASIC VI WITH GOWN DISP (CUSTOM PROCEDURE TRAY) ×2 IMPLANT
PENCIL BUTTON HOLSTER BLD 10FT (ELECTRODE) ×2 IMPLANT
SHEARS HARMONIC 9CM CVD (BLADE) ×2 IMPLANT
SPONGE GAUZE 4X4 12PLY (GAUZE/BANDAGES/DRESSINGS) ×2 IMPLANT
STAPLER VISISTAT 35W (STAPLE) IMPLANT
STRIP CLOSURE SKIN 1/2X4 (GAUZE/BANDAGES/DRESSINGS) ×2 IMPLANT
SUT MNCRL AB 4-0 PS2 18 (SUTURE) ×2 IMPLANT
SUT SILK 2 0 (SUTURE) ×1
SUT SILK 2-0 18XBRD TIE 12 (SUTURE) ×1 IMPLANT
SUT SILK 3 0 (SUTURE)
SUT SILK 3-0 18XBRD TIE 12 (SUTURE) IMPLANT
SUT VIC AB 3-0 SH 18 (SUTURE) ×4 IMPLANT
SUT VIC AB 3-0 SH 8-18 (SUTURE) ×2 IMPLANT
SYR BULB IRRIGATION 50ML (SYRINGE) ×2 IMPLANT
TAPE CLOTH SURG 4X10 WHT LF (GAUZE/BANDAGES/DRESSINGS) ×2 IMPLANT
TOWEL OR 17X26 10 PK STRL BLUE (TOWEL DISPOSABLE) ×2 IMPLANT
TOWEL OR NON WOVEN STRL DISP B (DISPOSABLE) ×2 IMPLANT
YANKAUER SUCT BULB TIP 10FT TU (MISCELLANEOUS) ×2 IMPLANT

## 2013-06-06 NOTE — Transfer of Care (Signed)
Immediate Anesthesia Transfer of Care Note  Patient: [name]  Procedure(s) Performed: Procedure(s): TOTAL THYROIDECTOMY (N/A)  Patient Location: PACU  Anesthesia Type:General  Level of Consciousness: awake, alert , oriented and patient cooperative  Airway & Oxygen Therapy: Patient Spontanous Breathing and Patient connected to face mask oxygen  Post-op Assessment: Report given to PACU RN, Post -op Vital signs reviewed and stable and Patient moving all extremities  Post vital signs: Reviewed and stable  Complications: No apparent anesthesia complications

## 2013-06-06 NOTE — H&P (Signed)
[name] is an 42 y.o. female.    General Surgery [facility]  Chief Complaint: bilateral thyroid nodules  HPI: The patient is a 42 year old female referred by her primary care physician for evaluation of bilateral thyroid nodules. Patient was diagnosed with multinodular goiter one year ago. She underwent ultrasound in April of 2013. On physical examination this year she was again noted to have prominent bilateral thyroid nodules. Ultrasound performed on 01/03/2013 is reviewed with the patient. This shows multiple thyroid nodules arising in a small multinodular goiter. Dominant nodule on the right measures 17 mm. Dominant nodule in the left measures 20 mm and has microcalcifications. Patient now presents for thyroidectomy.  Past Medical History  Diagnosis Date  . Hypertension     Past Surgical History  Procedure Laterality Date  . No past surgeries      History reviewed. No pertinent family history. Social History:  reports that she has never smoked. She has never used smokeless tobacco. She reports that she drinks alcohol. She reports that she does not use illicit drugs.  Allergies: No Known Allergies  Medications Prior to Admission  Medication Sig Dispense Refill  . amLODipine (NORVASC) 5 MG tablet Take 5 mg by mouth every morning.       Marland Kitchen b complex vitamins tablet Take 1 tablet by mouth daily.      . hydrochlorothiazide (HYDRODIURIL) 25 MG tablet Take 25 mg by mouth every morning.       . Multiple Vitamins-Minerals (MULTIVITAMIN WITH MINERALS) tablet Take 1 tablet by mouth daily.        Results for orders placed during the hospital encounter of 06/04/13 (from the past 48 hour(s))  BASIC METABOLIC PANEL     Status: Abnormal   Collection Time    06/04/13  9:25 AM      Result Value Range   Sodium 137  135 - 145 mEq/L   Potassium 3.7  3.5 - 5.1 mEq/L   Chloride 102  96 - 112 mEq/L   CO2 28  19 - 32 mEq/L   Glucose, Bld 93  70 - 99 mg/dL   BUN 15  6 -  23 mg/dL   Creatinine, Ser 1.61  0.50 - 1.10 mg/dL   Calcium 9.5  8.4 - 09.6 mg/dL   GFR calc non Af Amer 82 (*) >90 mL/min   GFR calc Af Amer >90  >90 mL/min   Comment: (NOTE)     The eGFR has been calculated using the CKD EPI equation.     This calculation has not been validated in all clinical situations.     eGFR's persistently <90 mL/min signify possible Chronic Kidney     Disease.  CBC     Status: Abnormal   Collection Time    06/04/13  9:25 AM      Result Value Range   WBC 5.8  4.0 - 10.5 K/uL   RBC 4.66  3.87 - 5.11 MIL/uL   Hemoglobin 12.1  12.0 - 15.0 g/dL   HCT 04.5  40.9 - 81.1 %   MCV 77.7 (*) 78.0 - 100.0 fL   MCH 26.0  26.0 - 34.0 pg   MCHC 33.4  30.0 - 36.0 g/dL   RDW 91.4  78.2 - 95.6 %   Platelets 311  150 - 400 K/uL  HCG, SERUM, QUALITATIVE     Status: None   Collection Time    06/04/13  9:25 AM      Result  Value Range   Preg, Serum NEGATIVE  NEGATIVE   Comment:            THE SENSITIVITY OF THIS     METHODOLOGY IS >10 mIU/mL.   Dg Chest 2 View  06/04/2013   CLINICAL DATA:  Hypertension.  EXAM: CHEST  2 VIEW  COMPARISON:  None.  FINDINGS: The heart size and mediastinal contours are within normal limits. Both lungs are clear. The visualized skeletal structures are unremarkable.  IMPRESSION: No active cardiopulmonary disease.   Electronically Signed   By: Maisie Fus  Register   On: 06/04/2013 10:00    Review of Systems  Constitutional: Negative.   HENT: Negative.   Eyes: Negative.   Respiratory: Negative.   Cardiovascular: Negative.   Gastrointestinal: Negative.   Genitourinary: Negative.   Musculoskeletal: Negative.   Skin: Negative.   Neurological: Negative.   Endo/Heme/Allergies: Negative.   Psychiatric/Behavioral: Negative.     Blood pressure 132/85, pulse 78, temperature 98.3 F (36.8 C), temperature source Oral, resp. rate 18, last menstrual period 05/30/2013, SpO2 99.00%. Physical Exam  Constitutional: She is oriented to person, place, and  time. She appears well-developed and well-nourished.  HENT:  Head: Normocephalic and atraumatic.  Right Ear: External ear normal.  Left Ear: External ear normal.  Eyes: Conjunctivae are normal. Pupils are equal, round, and reactive to light. No scleral icterus.  Neck: Normal range of motion. Neck supple. No tracheal deviation present. Thyromegaly present.  Palpable bilateral thyroid nodules  Cardiovascular: Normal rate, regular rhythm and normal heart sounds.   No murmur heard. Respiratory: Effort normal and breath sounds normal. She has no wheezes.  GI: Soft. Bowel sounds are normal. She exhibits no distension.  Musculoskeletal: Normal range of motion. She exhibits no edema.  Lymphadenopathy:    She has no cervical adenopathy.  Neurological: She is alert and oriented to person, place, and time.  Skin: Skin is warm and dry.  Psychiatric: She has a normal mood and affect. Her behavior is normal.     Assessment/Plan Bilateral thyroid nodules, neoplasm of thyroid of uncertain behavior  Plan total thyroidectomy  The risks and benefits of the procedure have been discussed at length with the patient.  The patient understands the proposed procedure, potential alternative treatments, and the course of recovery to be expected.  All of the patient's questions have been answered at this time.  The patient wishes to proceed with surgery.  Velora Heckler, MD, [facility] Office: [phone]    Airanna Partin M 06/06/2013, 9:00 AM

## 2013-06-06 NOTE — Anesthesia Preprocedure Evaluation (Addendum)
Anesthesia Evaluation  Patient identified by MRN, date of birth, ID band Patient awake    Reviewed: Allergy & Precautions, H&P , NPO status , Patient's Chart, lab work & pertinent test results  Airway Mallampati: II TM Distance: >3 FB Neck ROM: full    Dental no notable dental hx. (+) Teeth Intact and Dental Advisory Given   Pulmonary neg pulmonary ROS,  breath sounds clear to auscultation  Pulmonary exam normal       Cardiovascular Exercise Tolerance: Good hypertension, Pt. on medications Rhythm:regular Rate:Normal     Neuro/Psych negative neurological ROS  negative psych ROS   GI/Hepatic negative GI ROS, Neg liver ROS,   Endo/Other  negative endocrine ROS  Renal/GU negative Renal ROS  negative genitourinary   Musculoskeletal   Abdominal   Peds  Hematology negative hematology ROS (+)   Anesthesia Other Findings   Reproductive/Obstetrics negative OB ROS                          Anesthesia Physical Anesthesia Plan  ASA: II  Anesthesia Plan: General   Post-op Pain Management:    Induction: Intravenous  Airway Management Planned: Oral ETT  Additional Equipment:   Intra-op Plan:   Post-operative Plan: Extubation in OR  Informed Consent: I have reviewed the patients History and Physical, chart, labs and discussed the procedure including the risks, benefits and alternatives for the proposed anesthesia with the patient or authorized representative who has indicated his/her understanding and acceptance.   Dental Advisory Given  Plan Discussed with: CRNA and Surgeon  Anesthesia Plan Comments:         Anesthesia Quick Evaluation

## 2013-06-06 NOTE — Brief Op Note (Signed)
06/06/2013  10:59 AM  PATIENT:  [name]  42 y.o. female  PRE-OPERATIVE DIAGNOSIS:  Bilateral thyroid nodules  POST-OPERATIVE DIAGNOSIS:  same  PROCEDURE:  Procedure(s): TOTAL THYROIDECTOMY (N/A)  SURGEON:  Surgeon(s) and Role:    * Velora Heckler, MD - Primary    * Ardeth Sportsman, MD - Assisting  ANESTHESIA:   general  EBL:  Total I/O In: 1000 [I.V.:1000] Out: -   BLOOD ADMINISTERED:none  DRAINS: none   LOCAL MEDICATIONS USED:  NONE  SPECIMEN:  Excision  DISPOSITION OF SPECIMEN:  PATHOLOGY  COUNTS:  YES  TOURNIQUET:  * No tourniquets in log *  DICTATION: .Other Dictation: Dictation Number [id number]  PLAN OF CARE: Admit for overnight observation  PATIENT DISPOSITION:  PACU - hemodynamically stable.   Delay start of Pharmacological VTE agent (>24hrs) due to surgical blood loss or risk of bleeding: yes  Velora Heckler, MD, FACS General & Endocrine Surgery [facility] Office: [phone]

## 2013-06-06 NOTE — Anesthesia Postprocedure Evaluation (Signed)
  Anesthesia Post-op Note  Patient: [name]  Procedure(s) Performed: Procedure(s) (LRB): TOTAL THYROIDECTOMY (N/A)  Patient Location: PACU  Anesthesia Type: General  Level of Consciousness: awake and alert   Airway and Oxygen Therapy: Patient Spontanous Breathing  Post-op Pain: mild  Post-op Assessment: Post-op Vital signs reviewed, Patient's Cardiovascular Status Stable, Respiratory Function Stable, Patent Airway and No signs of Nausea or vomiting  Last Vitals:  Filed Vitals:   06/06/13 1215  BP: 179/105  Pulse: 56  Temp: 37 C  Resp: 16    Post-op Vital Signs: stable   Complications: No apparent anesthesia complications

## 2013-06-07 LAB — BASIC METABOLIC PANEL
CO2: 27 mEq/L (ref 19–32)
Calcium: 9.3 mg/dL (ref 8.4–10.5)
GFR calc Af Amer: >90 mL/min (ref >90)
GFR calc non Af Amer: 86 mL/min — ABNORMAL LOW (ref >90)
Glucose, Bld: 122 mg/dL — ABNORMAL HIGH (ref 70–99)
Potassium: 3.2 mEq/L — ABNORMAL LOW (ref 3.5–5.1)
Sodium: 133 mEq/L — ABNORMAL LOW (ref 135–145)

## 2013-06-07 MED ORDER — CALCIUM CARBONATE 1250 (500 CA) MG PO TABS: 2.0000 | ORAL_TABLET | Freq: Three times a day (TID) | ORAL | Status: DC

## 2013-06-07 MED ORDER — HYDROCODONE-ACETAMINOPHEN 5-325 MG PO TABS: 1.0000 | ORAL_TABLET | ORAL | Status: DC | PRN

## 2013-06-07 MED ORDER — LEVOTHYROXINE SODIUM 100 MCG PO TABS: 100.0000 ug | ORAL_TABLET | Freq: Every day | ORAL | Status: DC

## 2013-06-07 NOTE — Progress Notes (Signed)
Utilization Review completed.  

## 2013-06-07 NOTE — Progress Notes (Signed)
Patient ID: [name], female   DOB: 16-May-1971, 42 y.o.   MRN: [id number] 1 Day Post-Op  Subjective: Some pain, well controlled with medications. No other complaints.  Objective: Vital signs in last 24 hours: Temp:  [97.7 F (36.5 C)-99.2 F (37.3 C)] 98.4 F (36.9 C) (12/20 0545) Pulse Rate:  [56-87] 69 (12/20 0545) Resp:  [12-20] 18 (12/20 0545) BP: (123-179)/(69-105) 144/86 mmHg (12/20 0545) SpO2:  [98 %-100 %] 99 % (12/20 0545) Weight:  [224 lb 12.8 oz (101.969 kg)] 224 lb 12.8 oz (101.969 kg) (12/19 1700) Last BM Date: 06/06/13  Intake/Output from previous day: 12/19 0701 - 12/20 0700 In: 2866.7 [P.O.:360; I.V.:2506.7] Out: 2400 [Urine:2400] Intake/Output this shift:    General appearance: alert, cooperative and no distress Incision: Clean and dry without swelling  Lab Results:   Recent Labs  06/04/13 0925  WBC 5.8  HGB 12.1  HCT 36.2  PLT 311   BMET  Recent Labs  06/04/13 0925 06/07/13 0527  NA 137 133*  K 3.7 3.2*  CL 102 95*  CO2 28 27  GLUCOSE 93 122*  BUN 15 9  CREATININE 0.86 0.83  CALCIUM 9.5 9.3     Studies/Results: No results found.  Anti-infectives: Anti-infectives   Start     Dose/Rate Route Frequency Ordered Stop   06/06/13 0805  ceFAZolin (ANCEF) IVPB 2 g/50 mL premix     2 g 100 mL/hr over 30 Minutes Intravenous On call to O.R. 06/06/13 0805 06/06/13 0931      Assessment/Plan: s/p Procedure(s): TOTAL THYROIDECTOMY Doing well without complication. Okay for discharge.   LOS: 1 day    [name] T 06/07/2013

## 2013-06-07 NOTE — Op Note (Signed)
[name], [name]                 ACCOUNT NO.:  [id number]  MEDICAL RECORD NO.:  [id number]  LOCATION:  1526                         FACILITY:  [facility]  PHYSICIAN:  Velora Heckler, MD      DATE OF BIRTH:  02-17-1971  DATE OF PROCEDURE:  06/06/2013                               OPERATIVE REPORT   PREOPERATIVE DIAGNOSIS:  Bilateral thyroid nodules with cytologic atypia.  POSTOPERATIVE DIAGNOSIS:  Bilateral thyroid nodules with cytologic atypia.  PROCEDURE:  Total thyroidectomy.  SURGEON:  Velora Heckler, MD, FACS  ASSISTANT:  Ardeth Sportsman, MD  ANESTHESIA:  General per Dr. Ronelle Nigh.  ESTIMATED BLOOD LOSS:  Minimal.  PREPARATION:  ChloraPrep.  COMPLICATIONS:  None.  INDICATIONS:  The patient is a 42 year old female referred by her primary care physician for bilateral thyroid nodules.  She had been diagnosed with a multinodular thyroid gland approximately 1-1/2 years ago.  She underwent ultrasound in April 2013.  Bilateral thyroid nodules were identified.  No dominant mass on the right measured 17 mm. Dominant nodule on the left measured 20 mm and contained microcalcifications.  The patient underwent bilateral fine-needle aspiration biopsies.  Surgical resection was recommended for definitive diagnosis.  After consultation with her endocrinologist, the patient decided to proceed with thyroidectomy.  BODY OF REPORT:  Procedure was done in OR #1 at the [facility].  The patient was brought to the operating room, placed in a supine position on the operating room table.  Following administration of general anesthesia, the patient was positioned and then prepped and draped in the usual aseptic fashion.  After ascertaining that an adequate level of anesthesia had been achieved, a Kocher incision was made with a #15 blade.  Dissection was carried through subcutaneous tissues and platysma.  Hemostasis was achieved with the electrocautery.  Skin flaps were  elevated cephalad and caudad from the thyroid notch to the sternal notch.  A Mahorner self-retaining retractor was placed for exposure.  Strap muscles were incised in the midline and dissection was begun on the left side.  Left thyroid lobe was exposed.  Strap muscles were reflected laterally.  Middle thyroid vein was divided between Ligaclips with the Harmonic scalpel.  Superior pole was dissected out and superior pole vessels divided individually between small and medium Ligaclips with the Harmonic scalpel.  The superior parathyroid gland was identified and preserved on its vascular pedicle.  Gland was rolled further anteriorly.  Branches of the inferior thyroid artery were dissected out and divided individually between small and medium Ligaclips with the Harmonic scalpel.  Inferior venous tributaries were divided between Ligaclips.  Gland was rolled anteriorly and ligament of Allyson Sabal was released with the electrocautery.  Recurrent laryngeal nerve was identified and preserved along its course.  Gland was mobilized onto the anterior trachea.  There was a moderate-sized pyramidal lobe, which was dissected off the anterior thyroid cartilage and resected en bloc with the isthmus.  The isthmus was mobilized across the midline.  Dry pack was placed in the left neck.  Next, we turned our attention to the right thyroid lobe.  Again strap muscles were reflected laterally.  Middle thyroid vein was divided  with the Harmonic scalpel.  Superior pole vessels were dissected out and divided individually between small and medium Ligaclips with the Harmonic scalpel.  There was a prominent tubercle of Zuckerkandl, which was carefully dissected out.  Recurrent nerve lies along the posterior and superior margins of the tubercle.  It was gently dissected away from the thyroid tissue and preserved along its course.  Ligament of Allyson Sabal was transected with the electrocautery and the gland was  mobilized anteriorly.  Branches of the inferior thyroid artery were divided between small and medium Ligaclips with the Harmonic scalpel.  Inferior venous tributaries were divided between medium Ligaclips with the Harmonic Scalpel and the gland was mobilized onto the anterior trachea from which it was completely excised with the electrocautery.  A suture was used to mark the right superior pole of the thyroid gland. The entire gland was submitted to Pathology for review.  Neck was irrigated with warm saline.  Good hemostasis was obtained throughout.  Fibrillar was placed throughout the operative field.  Strap muscles were reapproximated in the midline with interrupted 3-0 Vicryl sutures.  Platysma was closed with interrupted 3-0 Vicryl sutures.  Skin was closed with a running 4-0 Monocryl subcuticular suture.  Wound was washed and dried.  Benzoin and Steri-Strips were applied.  Sterile dressings were applied.  The patient was awakened from anesthesia and brought to the recovery room.  The patient tolerated the procedure well.   Velora Heckler, MD, [facility] Office: [phone]    TMG/MEDQ  D:  06/06/2013  T:  06/07/2013  Job:  [id number]  cc:   [name], M.D. Fax: [phone]  [name], M.D. Fax: [phone]

## 2013-06-09 ENCOUNTER — Telehealth (INDEPENDENT_AMBULATORY_CARE_PROVIDER_SITE_OTHER): Payer: Self-pay | Admitting: *Deleted

## 2013-06-09 ENCOUNTER — Encounter (HOSPITAL_COMMUNITY): Payer: Self-pay | Admitting: Surgery

## 2013-06-09 NOTE — Telephone Encounter (Signed)
LMOM for pt to return my call.  I was calling pt to inform her of her postop appt with Dr. Gerrit Friends on 06/23/13 with an arrival time of 3:30pm.  Pt is to look out for a lab slip that was mailed out on 06/06/13 for pt to go have her labs drawn prior to her appt on 06/23/13.

## 2013-06-09 NOTE — Telephone Encounter (Signed)
Patient return call to office- post op appointment date & time was given to patient.  Also, made patient aware to go to [facility] for labs prior to her appointment.  Patient verbalized understanding.

## 2013-06-13 ENCOUNTER — Telehealth (INDEPENDENT_AMBULATORY_CARE_PROVIDER_SITE_OTHER): Payer: Self-pay | Admitting: General Surgery

## 2013-06-13 NOTE — Telephone Encounter (Signed)
LMOM to let patient know that her path report came back benign

## 2013-06-13 NOTE — Progress Notes (Signed)
Quick Note:  Please contact patient and notify of benign pathology results.  Emauri Krygier M. Delainy Mcelhiney, MD, FACS [facility] Office: [phone]   ______ 

## 2013-06-15 NOTE — Discharge Summary (Signed)
  Physician Discharge Summary [facility]  Patient ID: [name] MRN: [id number] DOB/AGE: 08-21-1970 42 y.o.  Admit date: 06/06/2013 Discharge date: 06/15/2013  Admission Diagnoses:  Bilateral thyroid nodules with atypia  Discharge Diagnoses:  Principal Problem:   Multinodular goiter (nontoxic) Active Problems:   Neoplasm of uncertain behavior of thyroid gland   Thyroid nodule   Discharged Condition: good  Hospital Course: The patient is a 42 year old female referred by her  primary care physician for bilateral thyroid nodules. She had been  diagnosed with a multinodular thyroid gland approximately 1-1/2 years  ago. She underwent ultrasound in April 2013. Bilateral thyroid nodules  were identified. No dominant mass on the right measured 17 mm.  Dominant nodule on the left measured 20 mm and contained  microcalcifications. The patient underwent bilateral fine-needle  aspiration biopsies. Surgical resection was recommended for definitive  diagnosis. After consultation with her endocrinologist, the patient  decided to proceed with thyroidectomy.  Patient underwent total thyroidectomy.  Post op course uncomplicated.  Prepared for discharge on POD#1.  Consults: None  Significant Diagnostic Studies: labs: BMET  Treatments: surgery: total thyroidectomy  Discharge Exam: Blood pressure 144/86, pulse 69, temperature 98.4 F (36.9 C), temperature source Oral, resp. rate 18, height 5\' 6"  (1.676 m), weight 224 lb 12.8 oz (101.969 kg), last menstrual period 05/30/2013, SpO2 99.00%.  See exam by Dr. Johna Sheriff 06/07/2013.  Disposition: Home with family  Discharge Orders   Future Appointments Provider Department Dept Phone   06/23/2013 3:45 PM Velora Heckler, MD [facility], [place] [phone]   Future Orders Complete By Expires   Discharge patient  As directed        Medication List         amLODipine 5 MG tablet  Commonly known as:  NORVASC   Take 5 mg by mouth every morning.     b complex vitamins tablet  Take 1 tablet by mouth daily.     calcium carbonate 1250 MG tablet  Commonly known as:  OS-CAL - dosed in mg of elemental calcium  Take 2 tablets (1,000 mg of elemental calcium total) by mouth 3 (three) times daily with meals.     hydrochlorothiazide 25 MG tablet  Commonly known as:  HYDRODIURIL  Take 25 mg by mouth every morning.     HYDROcodone-acetaminophen 5-325 MG per tablet  Commonly known as:  NORCO/VICODIN  Take 1-2 tablets by mouth every 4 (four) hours as needed for moderate pain.     levothyroxine 100 MCG tablet  Commonly known as:  SYNTHROID, LEVOTHROID  Take 1 tablet (100 mcg total) by mouth daily before breakfast.     multivitamin with minerals tablet  Take 1 tablet by mouth daily.           Follow-up Information   Follow up with Velora Heckler, MD. Schedule an appointment as soon as possible for a visit in 2 weeks.   Specialty:  General Surgery   Contact information:   [address] [place] [address] [phone]       Velora Heckler, MD, [facility] Office: [phone]   Signed: Velora Heckler 06/15/2013, 7:13 AM

## 2013-06-20 LAB — CALCIUM: Calcium: 9 mg/dL (ref 8.4–10.5)

## 2013-06-23 ENCOUNTER — Ambulatory Visit (INDEPENDENT_AMBULATORY_CARE_PROVIDER_SITE_OTHER): Payer: BC Managed Care – PPO | Admitting: Surgery

## 2013-06-23 ENCOUNTER — Encounter (INDEPENDENT_AMBULATORY_CARE_PROVIDER_SITE_OTHER): Payer: Self-pay | Admitting: Surgery

## 2013-06-23 VITALS — BP 118/68 | HR 84 | Temp 97.4°F | Resp 16 | Ht 65.0 in | Wt 230.4 lb

## 2013-06-23 DIAGNOSIS — E042 Nontoxic multinodular goiter: Secondary | ICD-10-CM

## 2013-06-23 NOTE — Progress Notes (Signed)
General Surgery [facility]  Chief Complaint  Patient presents with  . Routine Post Op    total thyroidectomy 06/06/2013    HISTORY: Patient is a 43 year old female who underwent total thyroidectomy for multinodular goiter on 06/06/2013. Final pathology shows no evidence of malignancy. Postoperative calcium level is normal at 9.0. Patient is taking Synthroid 100 mcg daily.  EXAM: Surgical incision is healing nicely. Steri-Strips are removed in the office today. Mild soft tissue swelling. No sign of seroma. No sign of infection. Voice quality is normal.  IMPRESSION: Status post total thyroidectomy for multinodular goiter  PLAN: Patient will begin applying topical creams to her incision. She will see her endocrinologist in a few weeks for laboratory work and adjustment of her thyroid hormone replacement dosage.  Patient will return for final wound check in 6 weeks.  Earnstine Regal, MD, [facility]   Visit Diagnoses: 1. Multinodular goiter (nontoxic)

## 2013-06-23 NOTE — Patient Instructions (Signed)
  COCOA BUTTER & VITAMIN E CREAM  ([employer] or other brand)  Apply cocoa butter/vitamin E cream to your incision 2 - 3 times daily.  Massage cream into incision for one minute with each application.  Use sunscreen (50 SPF or higher) for first 6 months after surgery if area is exposed to sun.  You may substitute Mederma or other scar reducing creams as desired.   

## 2013-08-05 ENCOUNTER — Encounter (INDEPENDENT_AMBULATORY_CARE_PROVIDER_SITE_OTHER): Payer: BC Managed Care – PPO | Admitting: Surgery

## 2013-08-08 ENCOUNTER — Encounter (INDEPENDENT_AMBULATORY_CARE_PROVIDER_SITE_OTHER): Payer: Self-pay | Admitting: Surgery

## 2013-12-27 IMAGING — US US SOFT TISSUE HEAD/NECK
1 series · 14 of 25 positions shown · non-contrast
Comparison: 10/04/2011

CLINICAL DATA: Thyroid nodules

THYROID ULTRASOUND
TECHNIQUE: Ultrasound examination of the thyroid gland and adjacent
soft tissues was performed.

[Series 1: us soft tissue head/neck · 0.08mm/px · 14 of 67 slices shown]
[im 1/67]
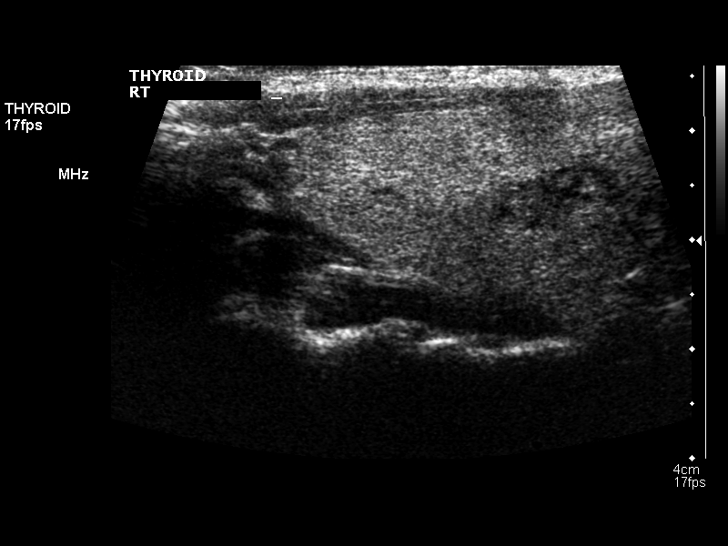
[im 6/67]
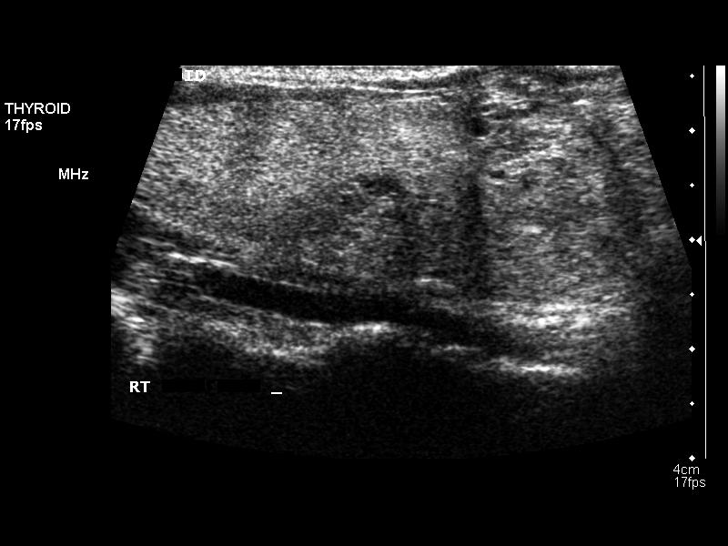
[im 12/67]
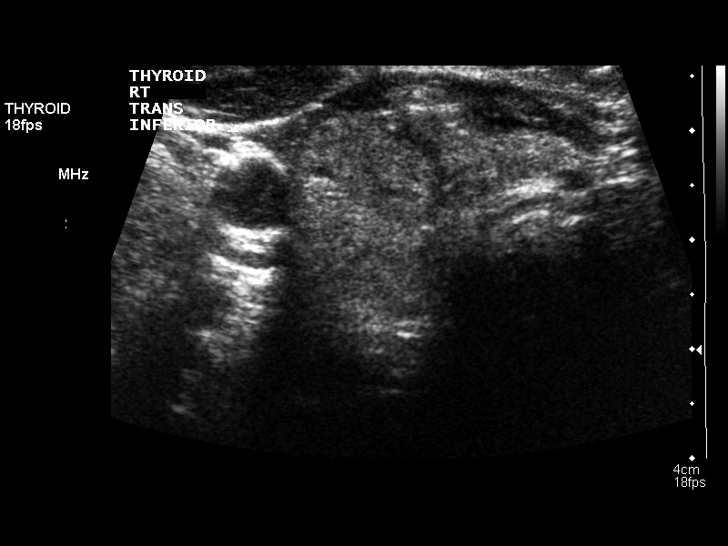
[im 17/67]
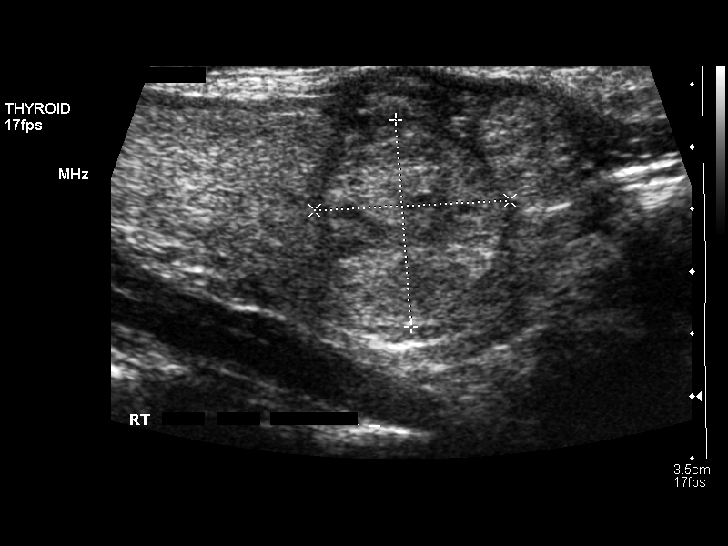
[im 23/67]
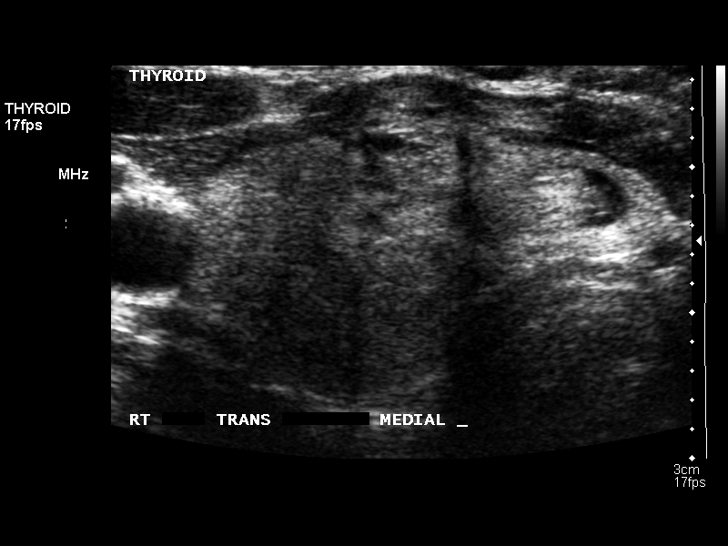
[im 25/67]
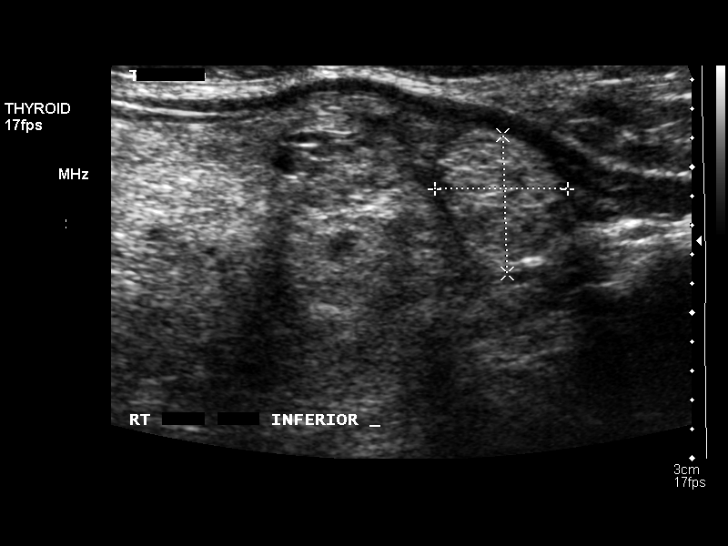
[im 31/67]
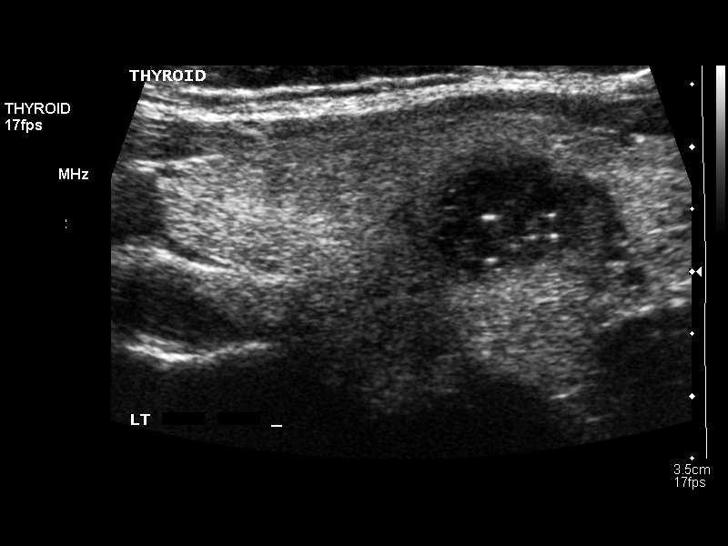
[im 36/67]
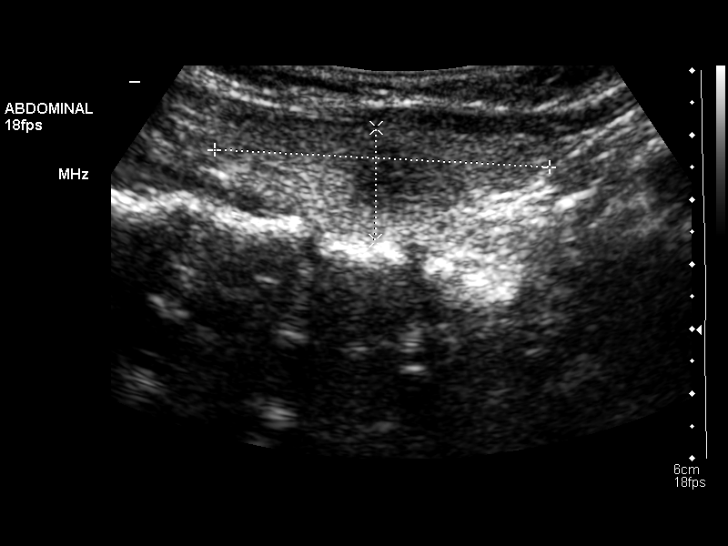
[im 42/67]
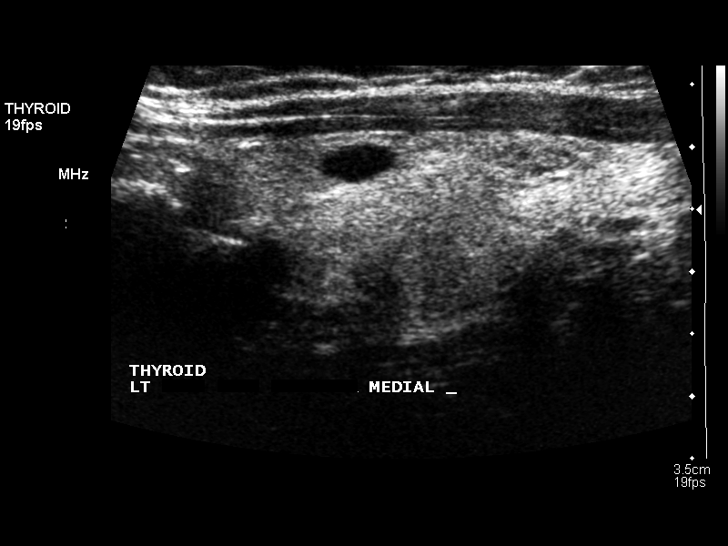
[im 45/67]
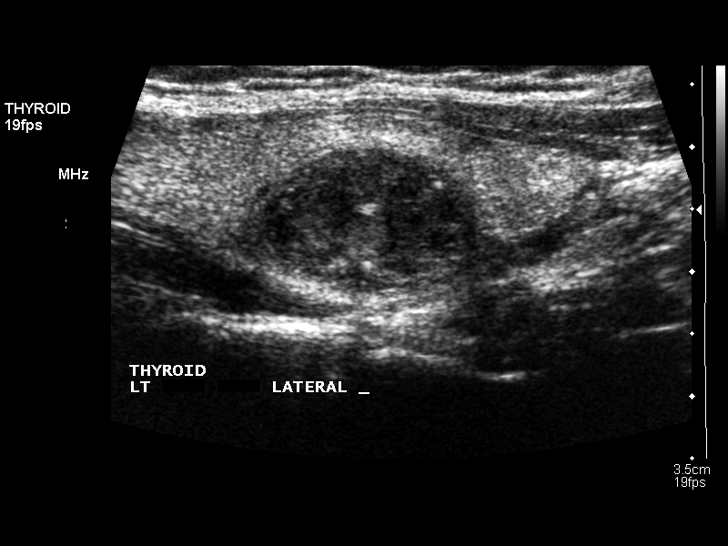
[im 50/67]
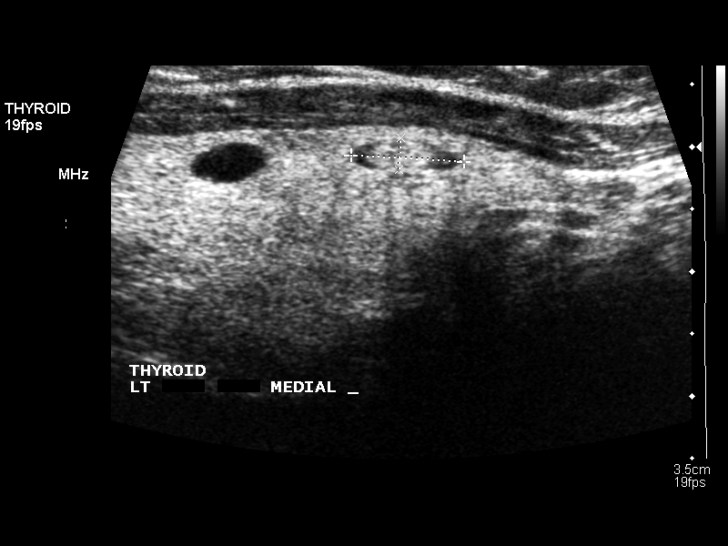
[im 56/67]
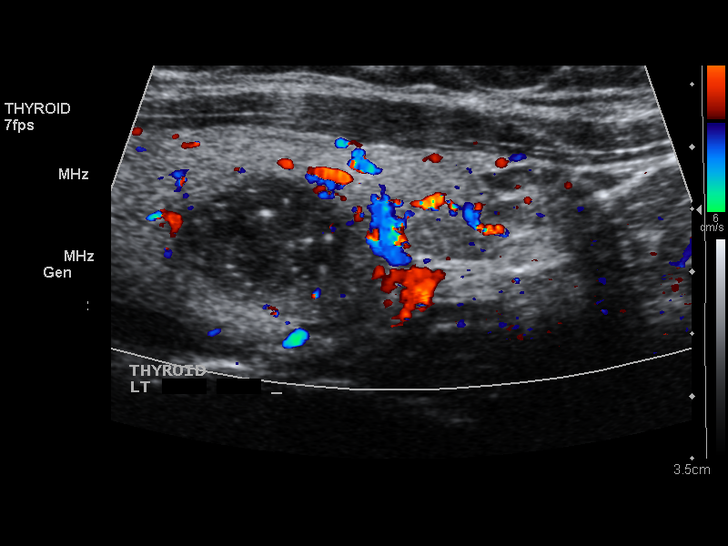
[im 61/67]
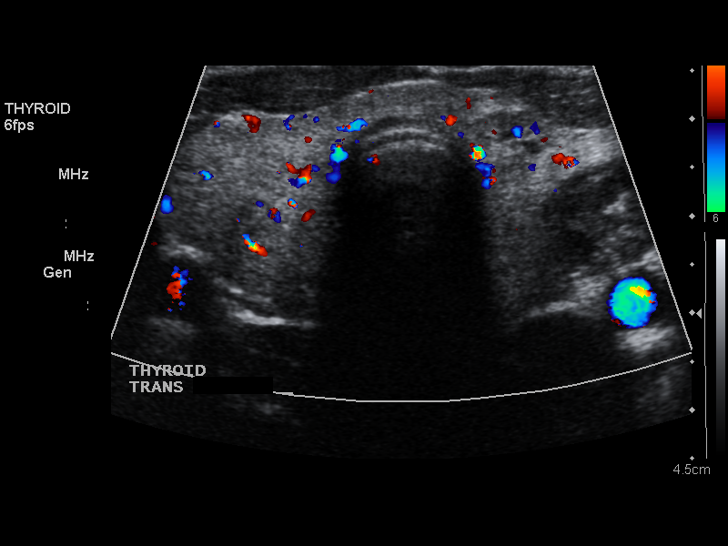
[im 67/67]
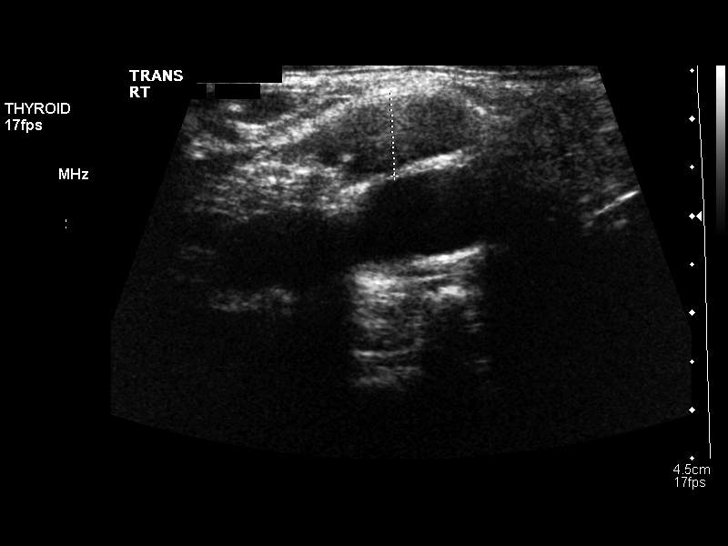

[14 of 25 positions shown; findings below may reference images not displayed]

FINDINGS: Right thyroid lobe:  60 x 24 x 22 mm, mildly inhomogeneous
background echotexture
Left thyroid lobe:  52 x 17 x 22 mm
Isthmus:  3 mm in thickness

Focal nodules:  6 x 7 cm solid, mid-right
9 x 9 x 11 mm complex solid, inferomedial right
9 x 10 x 10 mm solid, inferior right
12 x 14 x 20 mm hypoechoic with microcalcifications, mid-left
(previously 13 x 17 x 21)
2 x 5 x 9 mm solid, medial left
5 x 8 x 9 mm complex mostly solid, inferior left

Lymphadenopathy:  None visualized.
IMPRESSION: Little change in bilateral thyroid nodules.  The dominant bilateral
lesions meet consensus criteria for biopsy.  Ultrasound-guided fine
needle aspiration should be considered, as per the consensus
statement: Management of Thyroid Nodules Detected at US:  Society
of Radiologists in Ultrasound Consensus Conference Statement.

## 2014-01-28 IMAGING — US US THYROID BIOPSY
1 series · 14 of 18 positions shown · non-contrast
Comparison: none

Clinical Data/Indication: Multiple thyroid nodules

ULTRASOUND-GUIDED BIOPSY OF BILATERAL DOMINANT THYROID NODULES.
FINE NEEDLE ASPIRATION.
Procedure: The procedure, risks, benefits, and alternatives were
explained to the patient. Questions regarding the procedure were
encouraged and answered. The patient understands and consents to
the procedure.
The neck was prepped with betadine in a sterile fashion, and a
sterile drape was applied covering the operative field. A surgical
mask and sterile gloves were used for the procedure.
Under sonographic guidance, three 25 gauge fine needle aspirates of
the right thyroid nodule were obtained.  Subsequently, two 25 gauge
fine needle aspirates of the dominant left thyroid nodule were
obtained.  An additional 22 gauge left thyroid nodule fine needle
aspirate was obtained. Final imaging was performed.

[Series 1: us thyroid biopsy · 0.06mm/px · 18 acquisitions, 14 frames shown]
[im 1/18]
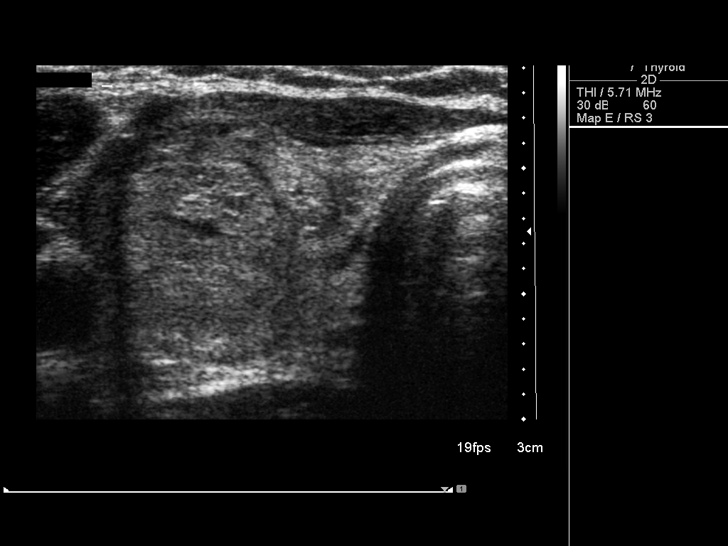
[im 2/18]
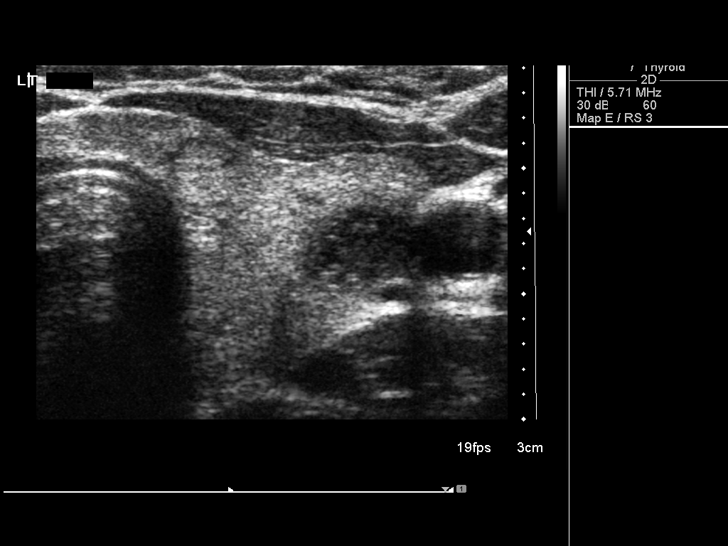
[im 4/18]
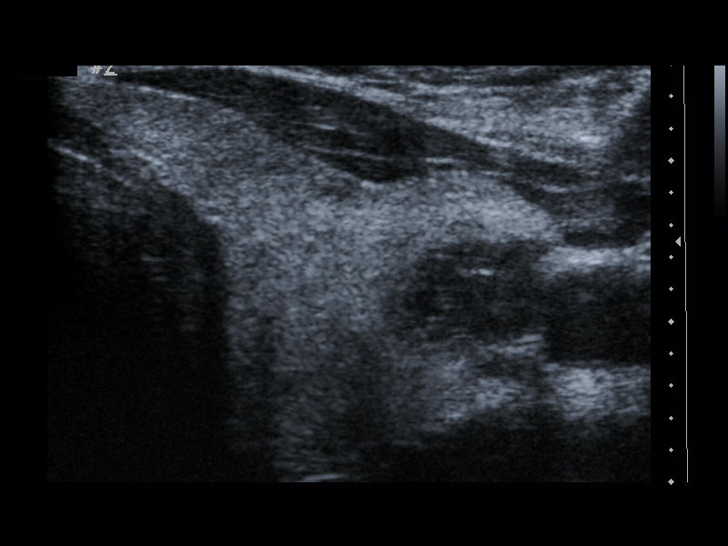
[im 5/18]
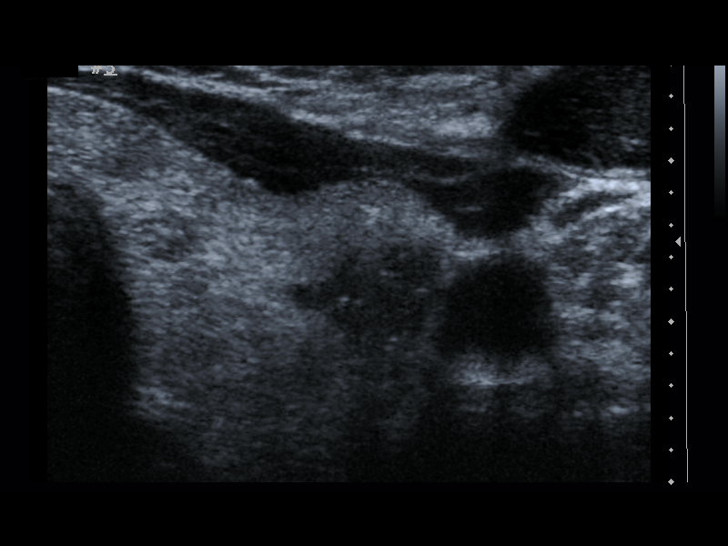
[im 6/18]
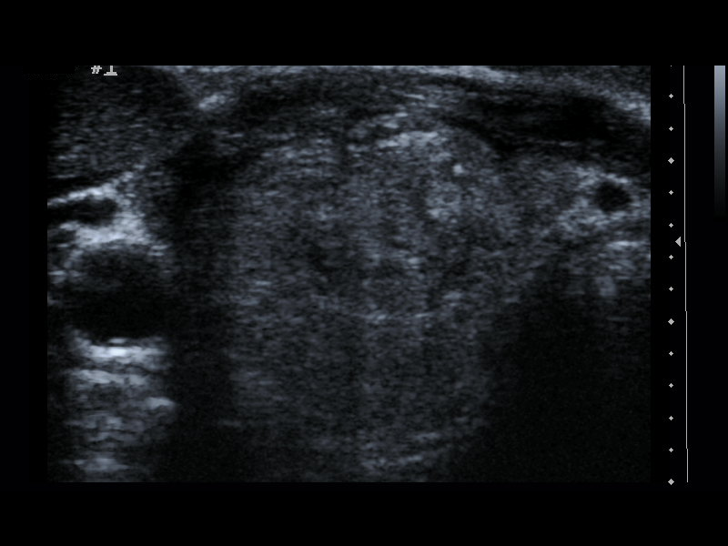
[im 8/18]
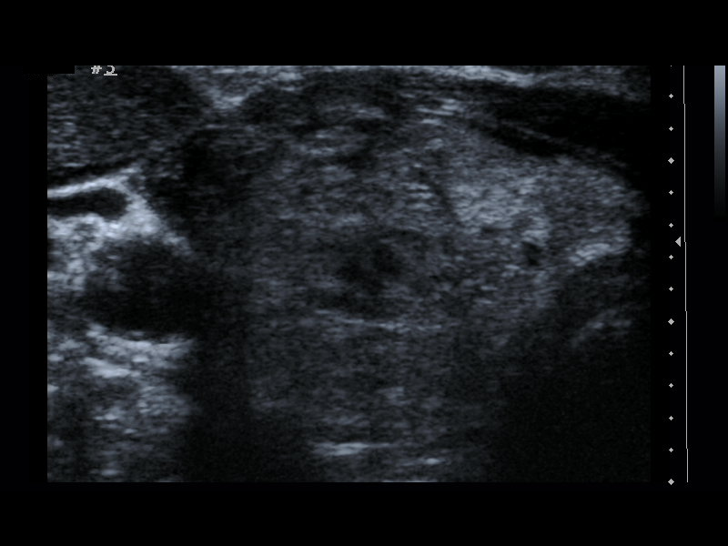
[im 9/18]
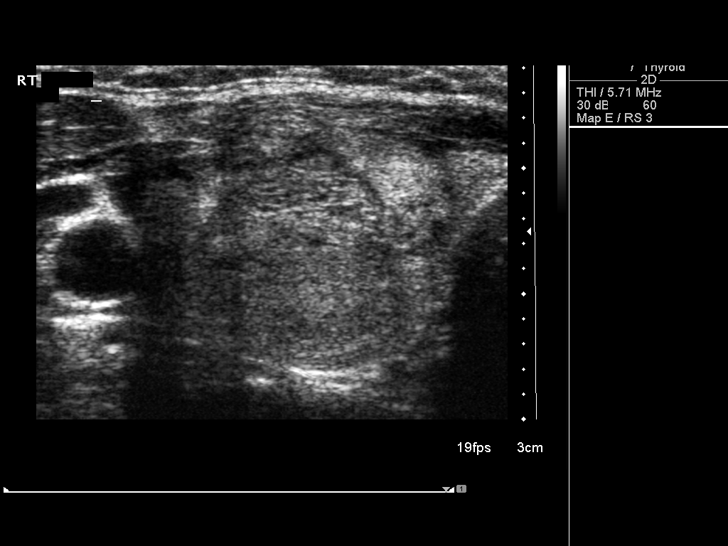
[im 10/18]
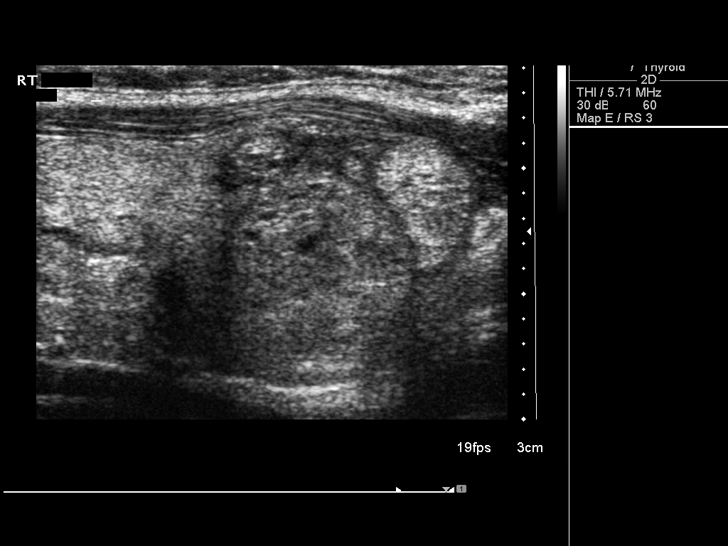
[im 11/18]
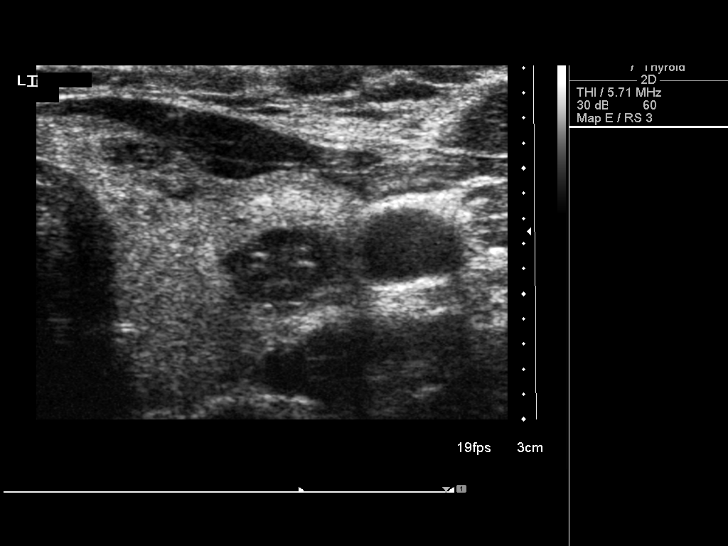
[im 13/18]
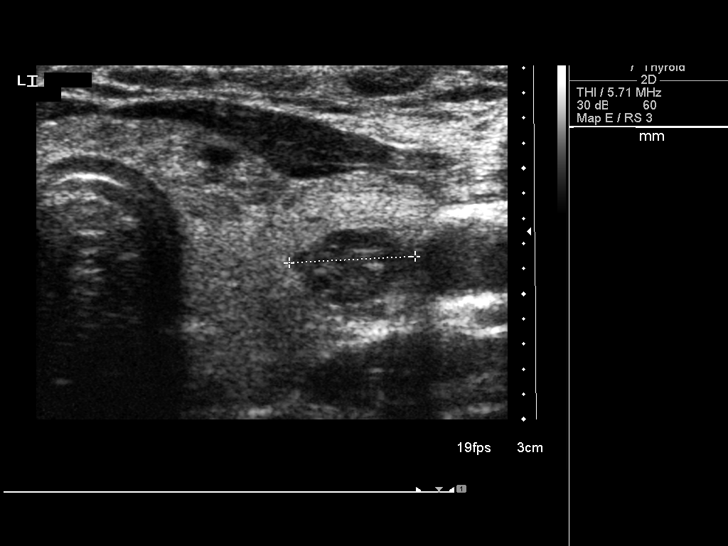
[im 14/18]
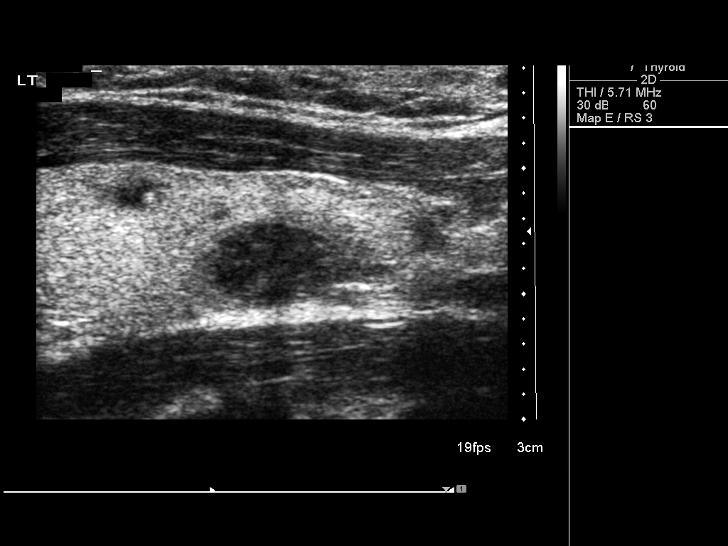
[im 15/18]
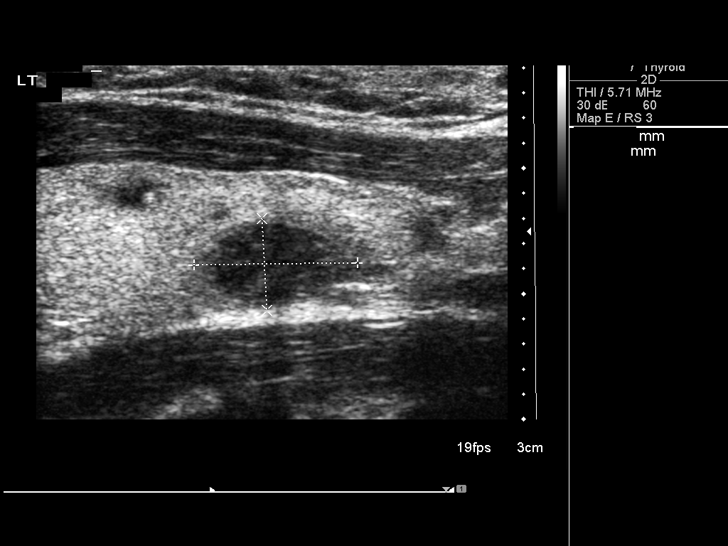
[im 17/18]
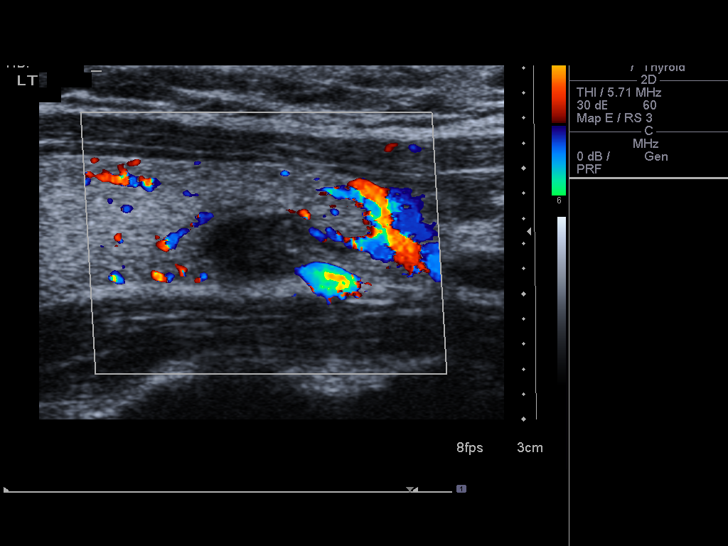
[im 18/18]
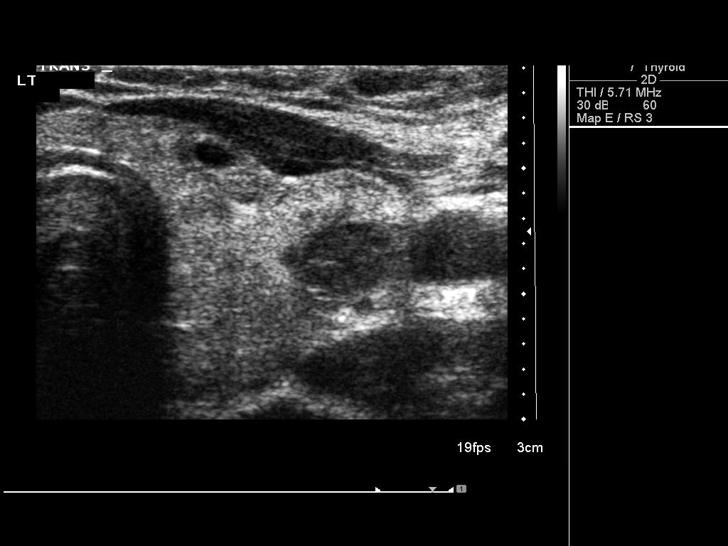

[14 of 18 positions shown; findings below may reference images not displayed]

FINDINGS: The images document guide needle placement within the
bilateral dominant thyroid nodules. Post biopsy images demonstrate
no evidence of hemorrhage.
IMPRESSION: Successful ultrasound-guided fine needle aspiration of bilateral
dominant thyroid nodules.

## 2014-05-28 IMAGING — CR DG CHEST 2V
2 series · 2 of 2 positions shown · non-contrast
Comparison: None.

CLINICAL DATA: Hypertension.

EXAM:
CHEST  2 VIEW

[w chest pa]
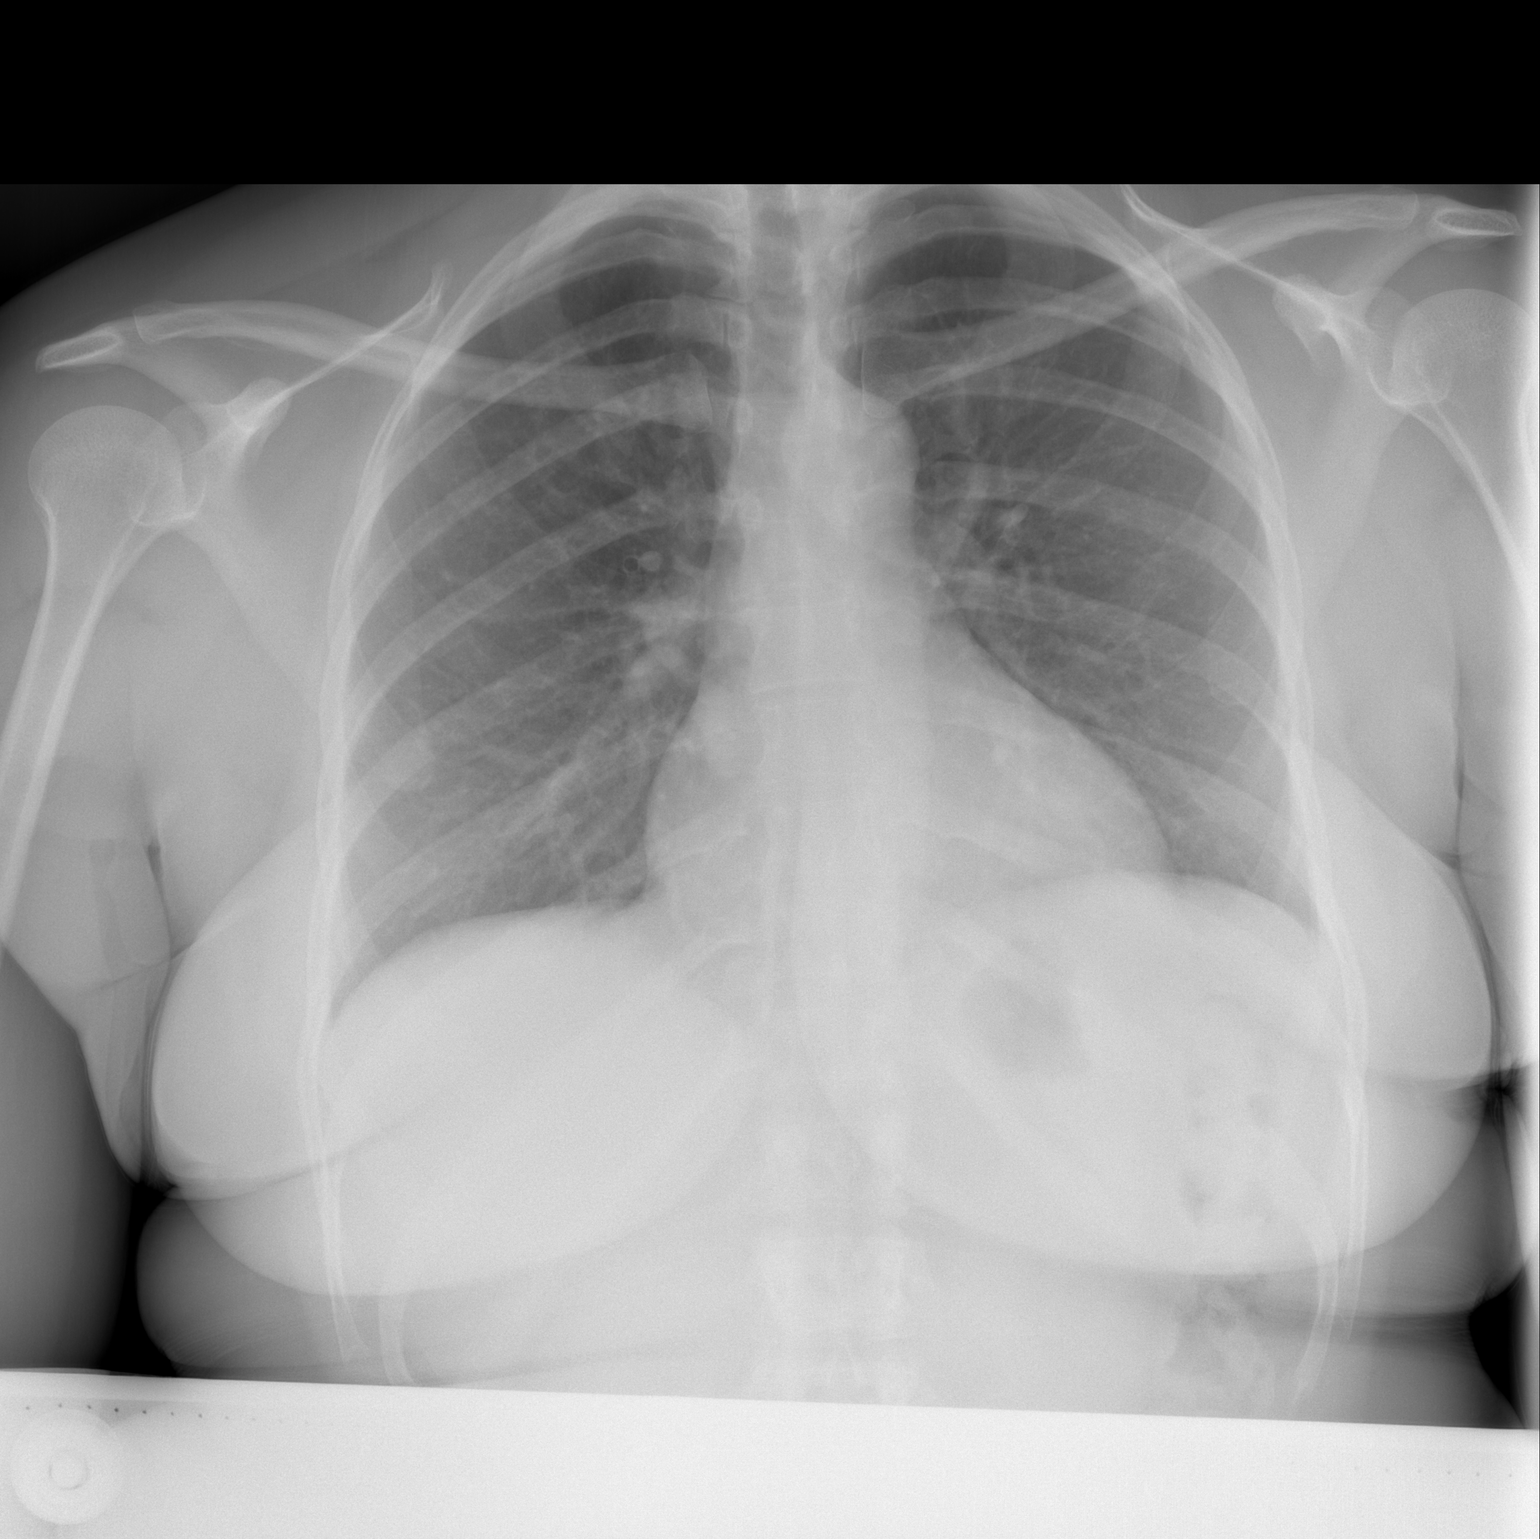

[w chest lat]
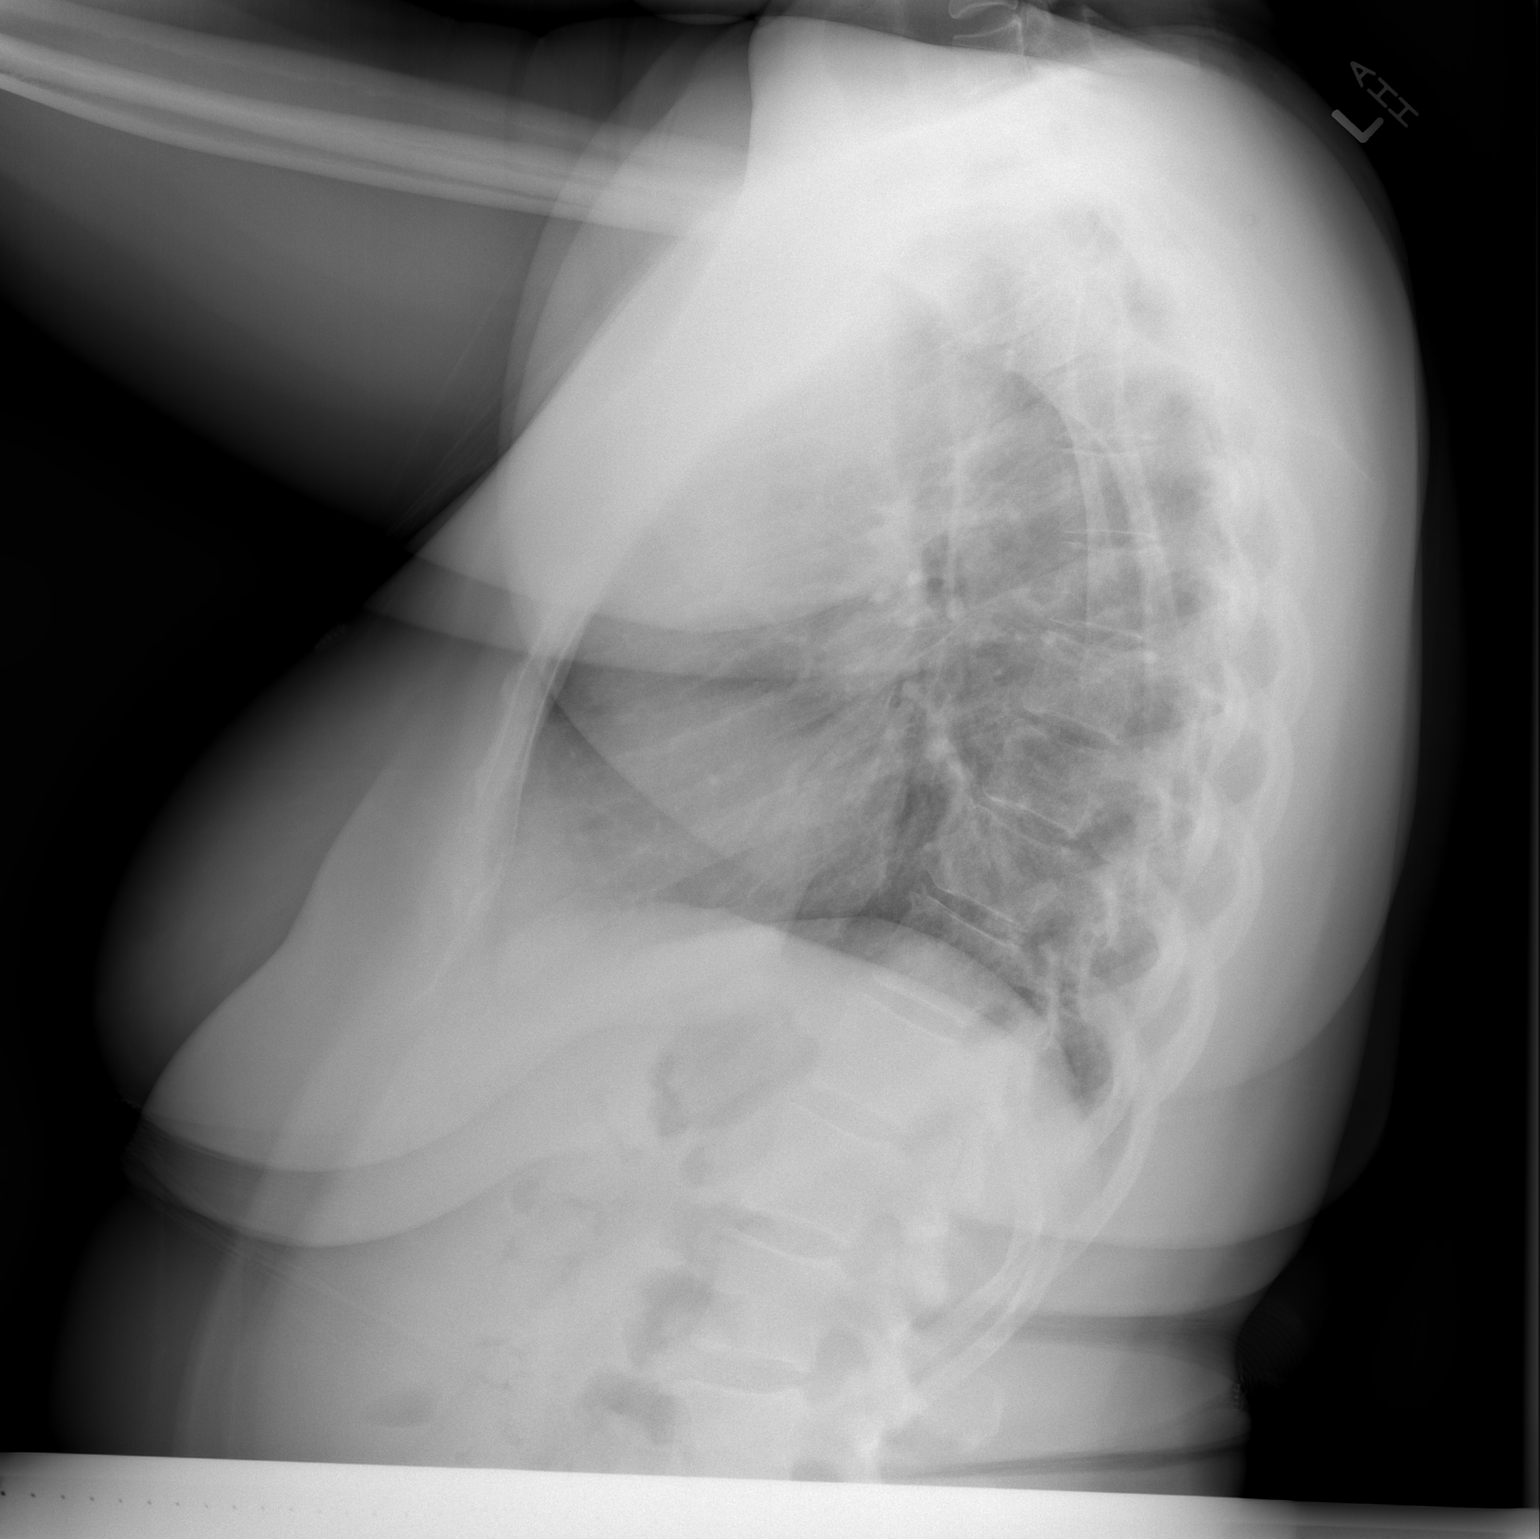

[2 of 2 positions shown; findings below may reference images not displayed]

FINDINGS: The heart size and mediastinal contours are within normal limits.
Both lungs are clear. The visualized skeletal structures are
unremarkable.
IMPRESSION: No active cardiopulmonary disease.

## 2017-08-21 DIAGNOSIS — N840 Polyp of corpus uteri: Secondary | ICD-10-CM | POA: Diagnosis not present

## 2017-08-21 DIAGNOSIS — Z6835 Body mass index (BMI) 35.0-35.9, adult: Secondary | ICD-10-CM | POA: Diagnosis not present

## 2017-08-21 DIAGNOSIS — Z01419 Encounter for gynecological examination (general) (routine) without abnormal findings: Secondary | ICD-10-CM | POA: Diagnosis not present

## 2017-08-21 DIAGNOSIS — Z1151 Encounter for screening for human papillomavirus (HPV): Secondary | ICD-10-CM | POA: Diagnosis not present

## 2017-09-03 DIAGNOSIS — N920 Excessive and frequent menstruation with regular cycle: Secondary | ICD-10-CM | POA: Diagnosis not present

## 2018-02-25 DIAGNOSIS — Z1231 Encounter for screening mammogram for malignant neoplasm of breast: Secondary | ICD-10-CM | POA: Diagnosis not present

## 2018-02-25 DIAGNOSIS — Z803 Family history of malignant neoplasm of breast: Secondary | ICD-10-CM | POA: Diagnosis not present

## 2018-03-07 DIAGNOSIS — E890 Postprocedural hypothyroidism: Secondary | ICD-10-CM | POA: Diagnosis not present

## 2018-04-29 DIAGNOSIS — I10 Essential (primary) hypertension: Secondary | ICD-10-CM | POA: Diagnosis not present

## 2018-04-29 DIAGNOSIS — Z23 Encounter for immunization: Secondary | ICD-10-CM | POA: Diagnosis not present

## 2018-04-29 DIAGNOSIS — Z0000 Encounter for general adult medical examination without abnormal findings: Secondary | ICD-10-CM | POA: Diagnosis not present

## 2018-04-29 DIAGNOSIS — E890 Postprocedural hypothyroidism: Secondary | ICD-10-CM | POA: Diagnosis not present

## 2018-04-29 DIAGNOSIS — H9190 Unspecified hearing loss, unspecified ear: Secondary | ICD-10-CM | POA: Diagnosis not present

## 2018-04-29 DIAGNOSIS — D509 Iron deficiency anemia, unspecified: Secondary | ICD-10-CM | POA: Diagnosis not present

## 2018-05-08 DIAGNOSIS — H9313 Tinnitus, bilateral: Secondary | ICD-10-CM | POA: Diagnosis not present

## 2018-05-08 DIAGNOSIS — H903 Sensorineural hearing loss, bilateral: Secondary | ICD-10-CM | POA: Diagnosis not present

## 2018-05-08 DIAGNOSIS — H90A22 Sensorineural hearing loss, unilateral, left ear, with restricted hearing on the contralateral side: Secondary | ICD-10-CM | POA: Diagnosis not present

## 2018-05-08 DIAGNOSIS — H90A31 Mixed conductive and sensorineural hearing loss, unilateral, right ear with restricted hearing on the contralateral side: Secondary | ICD-10-CM | POA: Diagnosis not present

## 2019-02-04 DIAGNOSIS — R079 Chest pain, unspecified: Secondary | ICD-10-CM | POA: Diagnosis not present

## 2019-02-28 ENCOUNTER — Ambulatory Visit: Payer: Self-pay | Admitting: Cardiovascular Disease

## 2019-03-21 DIAGNOSIS — Z803 Family history of malignant neoplasm of breast: Secondary | ICD-10-CM | POA: Diagnosis not present

## 2019-03-21 DIAGNOSIS — Z1231 Encounter for screening mammogram for malignant neoplasm of breast: Secondary | ICD-10-CM | POA: Diagnosis not present

## 2019-04-30 ENCOUNTER — Other Ambulatory Visit: Payer: Self-pay

## 2019-05-01 ENCOUNTER — Ambulatory Visit (INDEPENDENT_AMBULATORY_CARE_PROVIDER_SITE_OTHER): Payer: BC Managed Care – PPO | Admitting: Obstetrics & Gynecology

## 2019-05-01 ENCOUNTER — Encounter: Payer: Self-pay | Admitting: Obstetrics & Gynecology

## 2019-05-01 VITALS — BP 132/84 | Ht 65.0 in | Wt 223.0 lb

## 2019-05-01 DIAGNOSIS — Z3009 Encounter for other general counseling and advice on contraception: Secondary | ICD-10-CM | POA: Diagnosis not present

## 2019-05-01 DIAGNOSIS — E6609 Other obesity due to excess calories: Secondary | ICD-10-CM

## 2019-05-01 DIAGNOSIS — Z01419 Encounter for gynecological examination (general) (routine) without abnormal findings: Secondary | ICD-10-CM | POA: Diagnosis not present

## 2019-05-01 DIAGNOSIS — Z6837 Body mass index (BMI) 37.0-37.9, adult: Secondary | ICD-10-CM | POA: Diagnosis not present

## 2019-05-01 NOTE — Progress Notes (Signed)
[name] 12-07-1970 RP:[id number]   History:    48 y.o. G3P2A1 Single.  Youngest son is 49 yo.  RP:  Established patient presenting for annual gyn exam   HPI: Menses regular normal every month.  No breakthrough bleeding.  No pelvic pain.  Abstinent.  Urine and bowel movements normal.  Breasts normal. Body mass index 37.11. Planning to decrease calories in her nutrition and increase physical activities. Health labs with family physician.  Past medical history,surgical history, family history and social history were all reviewed and documented in the EPIC chart.  Gynecologic History Patient's last menstrual period was 04/25/2019. Contraception: abstinence Last Pap: 08/2017. Results were: Negative/HPV HR negative Last mammogram: 02/2018. Results were: Negative Bone Density: Never Colonoscopy: Never  Obstetric History OB History  Gravida Para Term Preterm AB Living  3 2     1 2   SAB TAB Ectopic Multiple Live Births  1            # Outcome Date GA Lbr Len/2nd Weight Sex Delivery Anes PTL Lv  3 SAB           2 Para           1 Para              ROS: A ROS was performed and pertinent positives and negatives are included in the history.  GENERAL: No fevers or chills. HEENT: No change in vision, no earache, sore throat or sinus congestion. NECK: No pain or stiffness. CARDIOVASCULAR: No chest pain or pressure. No palpitations. PULMONARY: No shortness of breath, cough or wheeze. GASTROINTESTINAL: No abdominal pain, nausea, vomiting or diarrhea, melena or bright red blood per rectum. GENITOURINARY: No urinary frequency, urgency, hesitancy or dysuria. MUSCULOSKELETAL: No joint or muscle pain, no back pain, no recent trauma. DERMATOLOGIC: No rash, no itching, no lesions. ENDOCRINE: No polyuria, polydipsia, no heat or cold intolerance. No recent change in weight. HEMATOLOGICAL: No anemia or easy bruising or bleeding. NEUROLOGIC: No headache, seizures, numbness, tingling or weakness.  PSYCHIATRIC: No depression, no loss of interest in normal activity or change in sleep pattern.     Exam:   Ht 5\' 5"  (1.651 m)   Wt 223 lb (101.2 kg)   LMP 04/25/2019   BMI 37.11 kg/m   Body mass index is 37.11 kg/m.  General appearance : Well developed well nourished female. No acute distress HEENT: Eyes: no retinal hemorrhage or exudates,  Neck supple, trachea midline, no carotid bruits, no thyroidmegaly Lungs: Clear to auscultation, no rhonchi or wheezes, or rib retractions  Heart: Regular rate and rhythm, no murmurs or gallops Breast:Examined in sitting and supine position were symmetrical in appearance, no palpable masses or tenderness,  no skin retraction, no nipple inversion, no nipple discharge, no skin discoloration, no axillary or supraclavicular lymphadenopathy Abdomen: no palpable masses or tenderness, no rebound or guarding Extremities: no edema or skin discoloration or tenderness  Pelvic: Vulva: Normal             Vagina: No gross lesions or discharge  Cervix: No gross lesions or discharge.  Uterus  AV, normal size, shape and consistency, non-tender and mobile  Adnexa  Without masses or tenderness  Anus: Normal   Assessment/Plan:  48 y.o. female for annual exam   1. Well female exam with routine gynecological exam Normal gynecologic exam.  Pap test March 2019 was negative with negative high-risk HPV, no indication to repeat this year.  Breast exam normal.  Will schedule a  screening mammogram now.  Health labs with family physician.  2. Encounter for other general counseling or advice on contraception Currently abstinent.  Will use condoms as needed.  3. Class 2 obesity due to excess calories without serious comorbidity with body mass index (BMI) of 37.0 to 37.9 in adult Recommend a low calorie/carb diet such as [employer].  Intermittent fasting discussed with patient.  I aerobic physical activities 5 times a week and weightlifting every 2 days.  Princess Bruins MD, 3:20 PM 05/01/2019

## 2019-05-01 NOTE — Patient Instructions (Signed)
1. Well female exam with routine gynecological exam Normal gynecologic exam.  Pap test March 2019 was negative with negative high-risk HPV, no indication to repeat this year.  Breast exam normal.  Will schedule a screening mammogram now.  Health labs with family physician.  2. Encounter for other general counseling or advice on contraception Currently abstinent.  Will use condoms as needed.  3. Class 2 obesity due to excess calories without serious comorbidity with body mass index (BMI) of 37.0 to 37.9 in adult Recommend a low calorie/carb diet such as [employer].  Intermittent fasting discussed with patient.  I aerobic physical activities 5 times a week and weightlifting every 2 days.  [name], it was a pleasure seeing you today!

## 2019-05-26 DIAGNOSIS — I10 Essential (primary) hypertension: Secondary | ICD-10-CM | POA: Diagnosis not present

## 2019-05-26 DIAGNOSIS — E890 Postprocedural hypothyroidism: Secondary | ICD-10-CM | POA: Diagnosis not present

## 2019-05-26 DIAGNOSIS — Z0000 Encounter for general adult medical examination without abnormal findings: Secondary | ICD-10-CM | POA: Diagnosis not present

## 2019-05-26 DIAGNOSIS — G43009 Migraine without aura, not intractable, without status migrainosus: Secondary | ICD-10-CM | POA: Diagnosis not present

## 2019-06-02 DIAGNOSIS — Z0000 Encounter for general adult medical examination without abnormal findings: Secondary | ICD-10-CM | POA: Diagnosis not present

## 2019-06-02 DIAGNOSIS — E890 Postprocedural hypothyroidism: Secondary | ICD-10-CM | POA: Diagnosis not present

## 2019-06-02 DIAGNOSIS — I10 Essential (primary) hypertension: Secondary | ICD-10-CM | POA: Diagnosis not present

## 2019-06-02 DIAGNOSIS — Z13220 Encounter for screening for lipoid disorders: Secondary | ICD-10-CM | POA: Diagnosis not present

## 2019-06-02 DIAGNOSIS — D509 Iron deficiency anemia, unspecified: Secondary | ICD-10-CM | POA: Diagnosis not present

## 2020-03-23 ENCOUNTER — Encounter: Payer: Self-pay | Admitting: Obstetrics & Gynecology

## 2020-05-03 ENCOUNTER — Encounter: Payer: BC Managed Care – PPO | Admitting: Obstetrics & Gynecology

## 2020-08-06 ENCOUNTER — Other Ambulatory Visit: Payer: Self-pay

## 2020-08-06 ENCOUNTER — Ambulatory Visit (INDEPENDENT_AMBULATORY_CARE_PROVIDER_SITE_OTHER): Payer: No Typology Code available for payment source | Admitting: Obstetrics & Gynecology

## 2020-08-06 ENCOUNTER — Encounter: Payer: Self-pay | Admitting: Obstetrics & Gynecology

## 2020-08-06 VITALS — BP 140/92 | Ht 65.0 in | Wt 212.0 lb

## 2020-08-06 DIAGNOSIS — Z3009 Encounter for other general counseling and advice on contraception: Secondary | ICD-10-CM

## 2020-08-06 DIAGNOSIS — E6609 Other obesity due to excess calories: Secondary | ICD-10-CM | POA: Diagnosis not present

## 2020-08-06 DIAGNOSIS — Z01419 Encounter for gynecological examination (general) (routine) without abnormal findings: Secondary | ICD-10-CM | POA: Diagnosis not present

## 2020-08-06 DIAGNOSIS — D509 Iron deficiency anemia, unspecified: Secondary | ICD-10-CM | POA: Diagnosis not present

## 2020-08-06 DIAGNOSIS — Z6835 Body mass index (BMI) 35.0-35.9, adult: Secondary | ICD-10-CM

## 2020-08-06 NOTE — Progress Notes (Signed)
[name] 1971-05-09 [id number]   History:    50 y.o. G3P2A1 Single.  Youngest son is 65 yo.  RP:  Established patient presenting for annual gyn exam   HPI: Menses regular normal every month.  No breakthrough bleeding.  No pelvic pain.  Abstinent.  Urine and bowel movements normal.  Breasts normal. Body mass index improved to 35.28. Health labs with family physician.  Past medical history,surgical history, family history and social history were all reviewed and documented in the EPIC chart.  Gynecologic History Patient's last menstrual period was 06/07/2020.  Obstetric History OB History  Gravida Para Term Preterm AB Living  3 2     1 2   SAB IAB Ectopic Multiple Live Births  1            # Outcome Date GA Lbr Len/2nd Weight Sex Delivery Anes PTL Lv  3 SAB           2 Para           1 Para              ROS: A ROS was performed and pertinent positives and negatives are included in the history.  GENERAL: No fevers or chills. HEENT: No change in vision, no earache, sore throat or sinus congestion. NECK: No pain or stiffness. CARDIOVASCULAR: No chest pain or pressure. No palpitations. PULMONARY: No shortness of breath, cough or wheeze. GASTROINTESTINAL: No abdominal pain, nausea, vomiting or diarrhea, melena or bright red blood per rectum. GENITOURINARY: No urinary frequency, urgency, hesitancy or dysuria. MUSCULOSKELETAL: No joint or muscle pain, no back pain, no recent trauma. DERMATOLOGIC: No rash, no itching, no lesions. ENDOCRINE: No polyuria, polydipsia, no heat or cold intolerance. No recent change in weight. HEMATOLOGICAL: No anemia or easy bruising or bleeding. NEUROLOGIC: No headache, seizures, numbness, tingling or weakness. PSYCHIATRIC: No depression, no loss of interest in normal activity or change in sleep pattern.     Exam:   BP (!) 140/92   Ht 5\' 5"  (1.651 m)   Wt 212 lb (96.2 kg)   LMP 06/07/2020   BMI 35.28 kg/m   Body mass index is 35.28  kg/m.  General appearance : Well developed well nourished female. No acute distress HEENT: Eyes: no retinal hemorrhage or exudates,  Neck supple, trachea midline, no carotid bruits, no thyroidmegaly Lungs: Clear to auscultation, no rhonchi or wheezes, or rib retractions  Heart: Regular rate and rhythm, no murmurs or gallops Breast:Examined in sitting and supine position were symmetrical in appearance, no palpable masses or tenderness,  no skin retraction, no nipple inversion, no nipple discharge, no skin discoloration, no axillary or supraclavicular lymphadenopathy Abdomen: no palpable masses or tenderness, no rebound or guarding Extremities: no edema or skin discoloration or tenderness  Pelvic: Vulva: Normal             Vagina: No gross lesions or discharge  Cervix: No gross lesions or discharge.  Pap reflex done.  Uterus AV, normal size, shape and consistency, non-tender and mobile  Adnexa  Without masses or tenderness  Anus: Normal   Assessment/Plan:  50 y.o. female for annual exam   1. Encounter for routine gynecological examination with Papanicolaou smear of cervix Normal gynecologic exam.  Pap reflex done.  Breast exam normal.  Screening mammogram October 2021 was negative.  Colonoscopy October 2021.  Health labs with family physician.  2. Encounter for other general counseling or advice on contraception Abstinent.  3. Iron deficiency anemia, unspecified iron  deficiency anemia type Investigating with Gastro.  Endoscopy negative.  ColoGard pending.  If GI investigation negative, recommend a Pelvic US here to r/o a Gyn cause to her anemia.  4. Class 2 obesity due to excess calories without serious comorbidity with body mass index (BMI) of 35.0 to 35.9 in adult Recommend a lower calorie/carb diet.  Intermittent fasting to consider.  Aerobic activities 5 times a week and light weightlifting every 2 days.  Princess Bruins MD, 2:44 PM 08/06/2020

## 2020-08-10 LAB — PAP IG W/ RFLX HPV ASCU

## 2020-08-14 ENCOUNTER — Encounter: Payer: Self-pay | Admitting: Obstetrics & Gynecology

## 2021-03-30 ENCOUNTER — Encounter: Payer: Self-pay | Admitting: Obstetrics & Gynecology

## 2022-01-27 ENCOUNTER — Encounter: Payer: Self-pay | Admitting: Obstetrics & Gynecology

## 2022-01-27 ENCOUNTER — Ambulatory Visit (INDEPENDENT_AMBULATORY_CARE_PROVIDER_SITE_OTHER): Payer: No Typology Code available for payment source | Admitting: Obstetrics & Gynecology

## 2022-01-27 ENCOUNTER — Other Ambulatory Visit (HOSPITAL_COMMUNITY)
Admission: RE | Admit: 2022-01-27 | Discharge: 2022-01-27 | Disposition: A | Discharge status: Home / Self Care | Payer: No Typology Code available for payment source | Source: Ambulatory Visit | Attending: Obstetrics & Gynecology | Admitting: Obstetrics & Gynecology

## 2022-01-27 VITALS — BP 110/76 | HR 58 | Ht 66.0 in | Wt 212.0 lb

## 2022-01-27 DIAGNOSIS — Z01419 Encounter for gynecological examination (general) (routine) without abnormal findings: Secondary | ICD-10-CM

## 2022-01-27 DIAGNOSIS — Z113 Encounter for screening for infections with a predominantly sexual mode of transmission: Secondary | ICD-10-CM | POA: Diagnosis present

## 2022-01-27 DIAGNOSIS — Z789 Other specified health status: Secondary | ICD-10-CM

## 2022-01-27 NOTE — Progress Notes (Signed)
[name] [birth year] [id number]   History:    51 y.o.  G3P2A1 Single.  Oldest son 33 yo.  Youngest son is 45+ yo.   RP:  Established patient presenting for annual gyn exam    HPI: Menses regular normal every month.  No breakthrough bleeding.  No pelvic pain.  Abstinent.  Pap 07/2020 Neg.  Pap reflex today. Would like STI screen prior to starting a relationship.  Urine and bowel movements normal. Breasts normal. Mammo Neg 03/2021. Body mass index improved to 34.22. Health labs with family physician. Colono 03/2020.   Past medical history,surgical history, family history and social history were all reviewed and documented in the EPIC chart.  Gynecologic History Patient's last menstrual period was 01/22/2022 (exact date).  Obstetric History OB History  Gravida Para Term Preterm AB Living  '3 2 2   1 2  '$ SAB IAB Ectopic Multiple Live Births  1            # Outcome Date GA Lbr Len/2nd Weight Sex Delivery Anes PTL Lv  3 SAB           2 Term           1 Term              ROS: A ROS was performed and pertinent positives and negatives are included in the history. GENERAL: No fevers or chills. HEENT: No change in vision, no earache, sore throat or sinus congestion. NECK: No pain or stiffness. CARDIOVASCULAR: No chest pain or pressure. No palpitations. PULMONARY: No shortness of breath, cough or wheeze. GASTROINTESTINAL: No abdominal pain, nausea, vomiting or diarrhea, melena or bright red blood per rectum. GENITOURINARY: No urinary frequency, urgency, hesitancy or dysuria. MUSCULOSKELETAL: No joint or muscle pain, no back pain, no recent trauma. DERMATOLOGIC: No rash, no itching, no lesions. ENDOCRINE: No polyuria, polydipsia, no heat or cold intolerance. No recent change in weight. HEMATOLOGICAL: No anemia or easy bruising or bleeding. NEUROLOGIC: No headache, seizures, numbness, tingling or weakness. PSYCHIATRIC: No depression, no loss of interest in normal activity or change in sleep  pattern.     Exam:   BP 110/76   Pulse (!) 58   Ht '5\' 6"'$  (1.676 m)   Wt 212 lb (96.2 kg)   LMP 01/22/2022 (Exact Date)   SpO2 99%   BMI 34.22 kg/m   Body mass index is 34.22 kg/m.  General appearance : Well developed well nourished female. No acute distress HEENT: Eyes: no retinal hemorrhage or exudates,  Neck supple, trachea midline, no carotid bruits, no thyroidmegaly Lungs: Clear to auscultation, no rhonchi or wheezes, or rib retractions  Heart: Regular rate and rhythm, no murmurs or gallops Breast:Examined in sitting and supine position were symmetrical in appearance, no palpable masses or tenderness,  no skin retraction, no nipple inversion, no nipple discharge, no skin discoloration, no axillary or supraclavicular lymphadenopathy Abdomen: no palpable masses or tenderness, no rebound or guarding Extremities: no edema or skin discoloration or tenderness  Pelvic: Vulva: Normal             Vagina: No gross lesions or discharge  Cervix: No gross lesions or discharge.  Pap/HPV HR, Gono-Chlam done.  Uterus  AV, normal size, shape and consistency, non-tender and mobile  Adnexa  Without masses or tenderness  Anus: Normal   Assessment/Plan:  51 y.o. female for annual exam   1. Encounter for routine gynecological examination with Papanicolaou smear of cervix Menses regular normal every month.  No breakthrough bleeding.  No pelvic pain.  Abstinent.  Pap 07/2020 Neg.  Pap reflex today. Would like STI screen prior to starting a relationship.  Urine and bowel movements normal. Breasts normal. Mammo Neg 03/2021. Body mass index improved to 34.22. Health labs with family physician. Colono 03/2020. - Cytology - PAP( [place])  2. Use of condoms for contraception  3. Screen for STD (sexually transmitted disease) Strict condom use recommended - HIV antibody (with reflex) - RPR - Hepatitis B Surface AntiGEN - Hepatitis C Antibody - Cytology - PAP( [facility])  Other orders -  tretinoin (RETIN-A) 0.05 % cream; SMARTSIG:sparingly Topical Every Night PRN - tretinoin (RETIN-A) 0.025 % cream; Apply topically at bedtime as needed. - SYNTHROID 137 MCG tablet; Take 137 mcg by mouth daily.   Princess Bruins MD, 3:33 PM 01/27/2022

## 2022-01-28 LAB — RPR: RPR Ser Ql: NONREACTIVE

## 2022-01-30 LAB — HEPATITIS B SURFACE ANTIGEN: Hepatitis B Surface Ag: NONREACTIVE

## 2022-01-30 LAB — HEPATITIS C ANTIBODY: Hepatitis C Ab: NONREACTIVE

## 2022-01-30 LAB — HIV ANTIBODY (ROUTINE TESTING W REFLEX): HIV 1&2 Ab, 4th Generation: NONREACTIVE

## 2022-02-01 LAB — CYTOLOGY - PAP
Chlamydia: NEGATIVE
Comment: NEGATIVE
Comment: NEGATIVE
Comment: NORMAL
Diagnosis: NEGATIVE
High risk HPV: NEGATIVE
Neisseria Gonorrhea: NEGATIVE

## 2022-02-13 ENCOUNTER — Encounter: Payer: Self-pay | Admitting: Obstetrics & Gynecology

## 2022-04-05 ENCOUNTER — Encounter: Payer: Self-pay | Admitting: Obstetrics & Gynecology

## 2022-11-08 ENCOUNTER — Encounter: Payer: Self-pay | Admitting: Obstetrics & Gynecology

## 2022-11-08 ENCOUNTER — Ambulatory Visit: Payer: No Typology Code available for payment source | Admitting: Obstetrics & Gynecology

## 2022-11-08 VITALS — BP 114/78 | HR 58

## 2022-11-08 DIAGNOSIS — N6325 Unspecified lump in the left breast, overlapping quadrants: Secondary | ICD-10-CM

## 2022-11-08 DIAGNOSIS — B372 Candidiasis of skin and nail: Secondary | ICD-10-CM | POA: Diagnosis not present

## 2022-11-08 MED ORDER — NYSTATIN 100000 UNIT/GM EX POWD: 1.0000 | Freq: Two times a day (BID) | CUTANEOUS | 3 refills | Status: DC

## 2022-11-08 NOTE — Progress Notes (Signed)
    [name] 10/09/1970 [id number]        52 y.o.  [id number]   RP: Itchiness under the breasts and groin areas  HPI: Pt c/o itchy, possible yeast, under breasts & groin areas.  C/O Lt breast tenderness at the mid external quadrant x about a month.     OB History  Gravida Para Term Preterm AB Living  3 2 2   1 2   SAB IAB Ectopic Multiple Live Births  1            # Outcome Date GA Lbr Len/2nd Weight Sex Delivery Anes PTL Lv  3 SAB           2 Term           1 Term             Past medical history,surgical history, problem list, medications, allergies, family history and social history were all reviewed and documented in the EPIC chart.   Directed ROS with pertinent positives and negatives documented in the history of present illness/assessment and plan.  Exam:  Vitals:   11/08/22 1014  BP: 114/78  Pulse: (!) 58  SpO2: 98%   General appearance:  Normal  Breast exam:  Rt breast and axilla normal.                        Lt breast:  Tender nodule 1.5 cm round, mobile at 2-3 O'Clock.   Erythema under both breasts.  Abdomen: Erythema under the panniculus and groin areas  Gynecologic exam: Deferred   Assessment/Plan:  52 y.o. [id number]   1. Yeast infection of the skin Pt c/o itchy, possible yeast, under breasts & groin areas.  Will treat with Nystatin powder.  Usage reviewed, prescription sent to pharmacy.  2. Breast lump on left side at 3 o'clock position C/O Lt breast tenderness at the mid external quadrant x about a month.Tender nodule 1.5 cm round, mobile at 2-3 O'Clock. Schedule Lt Dx mammo/US.  Other orders - SYNTHROID 150 MCG tablet; Take 150 mcg by mouth daily. - nystatin (MYCOSTATIN/NYSTOP) powder; Apply 1 Application topically 2 (two) times daily.   Genia Del MD, 10:31 AM 11/08/2022

## 2022-11-09 ENCOUNTER — Telehealth: Payer: Self-pay

## 2022-11-09 NOTE — Telephone Encounter (Signed)
-----   Message from Genia Del, MD sent at 11/08/2022 10:41 AM EDT ----- Regarding: Schedule left Dx mammo/US Left breast tenderness with a 1.5 cm mobile nodule at 2-3 O'Clock.

## 2022-11-09 NOTE — Telephone Encounter (Signed)
[facility] order filled out and placed on Dr. Sharol Roussel desk for authorization.

## 2022-11-17 ENCOUNTER — Encounter: Payer: Self-pay | Admitting: Obstetrics & Gynecology

## 2022-11-20 NOTE — Telephone Encounter (Signed)
Order was faxed successfully on 11/09/2022.  Pt's imaging done on 11/16/2022. Results in chart. Will route to provider and close.

## 2022-11-21 ENCOUNTER — Telehealth: Payer: Self-pay

## 2022-11-21 NOTE — Telephone Encounter (Signed)
Pt LVM reporting that powder for itching/irritation under breasts and in between legs is not providing any relief and is inquiring if there are any other prescription options? Please advise.

## 2022-11-22 MED ORDER — NYSTATIN-TRIAMCINOLONE 100000-0.1 UNIT/GM-% EX CREA: 1.0000 | TOPICAL_CREAM | Freq: Two times a day (BID) | CUTANEOUS | 0 refills | Status: DC

## 2022-11-22 NOTE — Telephone Encounter (Signed)
Rx is pended.

## 2022-11-22 NOTE — Telephone Encounter (Signed)
Genia Del, MD  Selinda Eon, CMA1 hour ago (12:14 PM)    Please send Mycolog cream prescription. Dr. Pennie Banter with patient, advised per Dr. Seymour Bars. Patient verbalizes understanding and is agreeable. Pharmacy confirmed.    Dr. Seymour Bars -I have pended Rx for Mycolog II cream, can you confirm?

## 2023-01-31 ENCOUNTER — Ambulatory Visit: Payer: No Typology Code available for payment source | Admitting: Obstetrics & Gynecology

## 2023-02-20 ENCOUNTER — Other Ambulatory Visit: Payer: Self-pay | Admitting: Obstetrics & Gynecology

## 2023-02-20 NOTE — Telephone Encounter (Signed)
Please send to Dr. Karma Greaser since she is schedule to see her next week.

## 2023-02-20 NOTE — Telephone Encounter (Signed)
Med refill request: Nystatin Last AEX: 01/27/22 Next AEX: 02/26/22 with EB Last MMG (if hormonal med) 11/16/22 Refill authorized: Please Advise, #30g, 0 RF

## 2023-02-21 NOTE — Telephone Encounter (Signed)
Med refill request: Nystatin Last AEX: 01/27/22 Next AEX: 02/26/22 with EB Last MMG (if hormonal med) 11/16/22 Refill authorized: Please Advise, #30g, 0 RF

## 2023-02-27 ENCOUNTER — Other Ambulatory Visit (HOSPITAL_COMMUNITY)
Admission: RE | Admit: 2023-02-27 | Discharge: 2023-02-27 | Disposition: A | Discharge status: Home / Self Care | Payer: No Typology Code available for payment source | Source: Ambulatory Visit | Attending: Obstetrics and Gynecology | Admitting: Obstetrics and Gynecology

## 2023-02-27 ENCOUNTER — Encounter: Payer: Self-pay | Admitting: Obstetrics and Gynecology

## 2023-02-27 ENCOUNTER — Ambulatory Visit (INDEPENDENT_AMBULATORY_CARE_PROVIDER_SITE_OTHER): Payer: No Typology Code available for payment source | Admitting: Obstetrics and Gynecology

## 2023-02-27 VITALS — BP 114/76 | HR 54 | Ht 65.75 in | Wt 203.0 lb

## 2023-02-27 DIAGNOSIS — D219 Benign neoplasm of connective and other soft tissue, unspecified: Secondary | ICD-10-CM | POA: Diagnosis not present

## 2023-02-27 DIAGNOSIS — N912 Amenorrhea, unspecified: Secondary | ICD-10-CM | POA: Diagnosis not present

## 2023-02-27 DIAGNOSIS — Z01419 Encounter for gynecological examination (general) (routine) without abnormal findings: Secondary | ICD-10-CM

## 2023-02-27 NOTE — Addendum Note (Signed)
Addended by: Earley Favor on: 02/27/2023 02:13 PM   Modules accepted: Orders

## 2023-02-27 NOTE — Progress Notes (Signed)
52 y.o. y.o. female here for annual exam. She denies any vaginal bleeding x 6 months. H/o menorrhagia and anemia when she had her cycles.   HRT: denies, trying tumeric for knee pain. No hot flashes Pelvic discharge: denies Pelvic pain: denies Last mammogram: 2024 birads 2 Last colonoscopy: 2021  Blood pressure 114/76, pulse (!) 54, height 5' 5.75" (1.67 m), weight 203 lb (92.1 kg), SpO2 99%.     Component Value Date/Time   DIAGPAP  01/27/2022 1609    - Negative for intraepithelial lesion or malignancy (NILM)   HPVHIGH Negative 01/27/2022 1609   ADEQPAP  01/27/2022 1609    Satisfactory for evaluation; transformation zone component PRESENT.    GYN HISTORY:    Component Value Date/Time   DIAGPAP  01/27/2022 1609    - Negative for intraepithelial lesion or malignancy (NILM)   HPVHIGH Negative 01/27/2022 1609   ADEQPAP  01/27/2022 1609    Satisfactory for evaluation; transformation zone component PRESENT.   Denies any abnormal  OB History  Gravida Para Term Preterm AB Living  3 2 2   1 2   SAB IAB Ectopic Multiple Live Births  1            # Outcome Date GA Lbr Len/2nd Weight Sex Type Anes PTL Lv  3 SAB           2 Term           1 Term             Past Medical History:  Diagnosis Date   Goiter    Hypertension     Past Surgical History:  Procedure Laterality Date   NO PAST SURGERIES     THYROIDECTOMY N/A 06/06/2013   Procedure: TOTAL THYROIDECTOMY;  Surgeon: Velora Heckler, MD;  Location: [facility];  Service: General;  Laterality: N/A;   UPPER GI ENDOSCOPY  03/2020    Current Outpatient Medications on File Prior to Visit  Medication Sig Dispense Refill   amLODipine (NORVASC) 5 MG tablet Take 5 mg by mouth every morning.      hydrochlorothiazide (HYDRODIURIL) 25 MG tablet Take 25 mg by mouth every morning.      Multiple Vitamin (MULTIVITAMIN PO) Take by mouth.     nystatin-triamcinolone (MYCOLOG II) cream APPLY TO AFFECTED AREA TWICE A DAY 30 g 0   SYNTHROID  150 MCG tablet Take 150 mcg by mouth daily.     tretinoin (RETIN-A) 0.025 % cream Apply topically at bedtime as needed.     tretinoin (RETIN-A) 0.05 % cream SMARTSIG:sparingly Topical Every Night PRN     No current facility-administered medications on file prior to visit.    Social History   Socioeconomic History   Marital status: Single    Spouse name: Not on file   Number of children: Not on file   Years of education: Not on file   Highest education level: Not on file  Occupational History   Not on file  Tobacco Use   Smoking status: Never   Smokeless tobacco: Never  Vaping Use   Vaping status: Never Used  Substance and Sexual Activity   Alcohol use: Yes    Comment: occasional   Drug use: No   Sexual activity: Not Currently    Partners: Male    Birth control/protection: Abstinence    Comment: 1st intercourse- 59, partners- 4,   Other Topics Concern   Not on file  Social History Narrative   Not on file  Social Determinants of Health   Financial Resource Strain: Not on file  Food Insecurity: Not on file  Transportation Needs: Not on file  Physical Activity: Not on file  Stress: Not on file  Social Connections: Not on file  Intimate Partner Violence: Not on file    Family History  Problem Relation Age of Onset   Hypertension Mother    Other Mother        voice box cancer   Hypertension Father    Heart disease Father        stints   Hypertension Sister    Diabetes Sister    Breast cancer Sister    Thyroid cancer Sister    Hypertension Brother    Lung cancer Maternal Grandmother    Heart attack Paternal Grandfather        died at 42     Allergies  Allergen Reactions   Doxycycline Hyclate     Other reaction(s): rash and swelling      Patient's last menstrual period was No LMP recorded. Patient is perimenopausal..          Sexually active: abstinent Exercising: regularly   Review of Systems Alls systems reviewed and are negative.      PE General appearance: alert, cooperative and appears stated age Head: Normocephalic, without obvious abnormality, atraumatic Neck: no adenopathy, supple, symmetrical, trachea midline and thyroid normal to inspection and palpation Lungs: clear to auscultation bilaterally Breasts: normal appearance, no masses or tenderness Heart: regular rate and rhythm Abdomen: soft, non-tender; bowel sounds normal; no masses,  no organomegaly Extremities: extremities normal, atraumatic, no cyanosis or edema Skin: Skin color, texture, turgor normal. No rashes or lesions Lymph nodes: Cervical, supraclavicular, and axillary nodes normal. No abnormal inguinal nodes palpated Neurologic: Grossly normal     Pelvic: External genitalia:  no lesions              Urethra:  normal appearing urethra with no masses, tenderness or lesions              Bartholins and Skenes: normal                 Vagina: normal appearing vagina with normal color and discharge, no lesions.               Cervix: no lesions, no cervical motion tenderness               Bimanual Exam:  Uterus:  slight increase in size, irregular contour, position, consistency, mobility, non-tender              Adnexa: no mass, fullness, tenderness          Chaperone was present for exam.   A:         Well Woman GYN exam, fibroids                             P:        Pap smear collected             Encouraged annual mammogram screening Counseled to return with any PM bleeding or DUB,  to evaluate for endometrial cancer.  To check hormone panel today. TV US ordered to evaluate fibroids.             Labs and immunizations with her primary             Discussed breast self exams  Encouraged safe sexual practices and enouraged healthy lifestyle practices with diet and exercise  Earley Favor

## 2023-03-03 LAB — TESTOS,TOTAL,FREE AND SHBG (FEMALE)
Free Testosterone: 1.6 pg/mL (ref 0.1–6.4)
Sex Hormone Binding: 65.9 nmol/L (ref 17–124)
Testosterone, Total, LC-MS-MS: 17 ng/dL (ref 2–45)

## 2023-03-03 LAB — FOLLICLE STIMULATING HORMONE: FSH: 64.2 m[IU]/mL

## 2023-03-03 LAB — ESTRADIOL: Estradiol: 17 pg/mL

## 2023-03-09 LAB — CYTOLOGY - PAP: Diagnosis: NEGATIVE

## 2023-03-15 NOTE — Progress Notes (Deleted)
GYNECOLOGY  VISIT   HPI: 52 y.o.   Single  African American  female   [phone] with No LMP recorded. Patient is perimenopausal.   here for   HRT consult  GYNECOLOGIC HISTORY: No LMP recorded. Patient is perimenopausal. Contraception:  peri Menopausal hormone therapy:  n/a Last mammogram:  11/16/22 Breast Density Cat B, BI-RADS CAT 2 benign Last pap smear:   02/27/23 neg, 01/27/22 neg: HR HPV neg        OB History     Gravida  3   Para  2   Term  2   Preterm      AB  1   Living  2      SAB  1   IAB      Ectopic      Multiple      Live Births                 Patient Active Problem List   Diagnosis Date Noted   Multinodular goiter (nontoxic) 01/28/2013    Past Medical History:  Diagnosis Date   Goiter    Hypertension     Past Surgical History:  Procedure Laterality Date   NO PAST SURGERIES     THYROIDECTOMY N/A 06/06/2013   Procedure: TOTAL THYROIDECTOMY;  Surgeon: Velora Heckler, MD;  Location: [facility];  Service: General;  Laterality: N/A;   UPPER GI ENDOSCOPY  03/2020    Current Outpatient Medications  Medication Sig Dispense Refill   amLODipine (NORVASC) 5 MG tablet Take 5 mg by mouth every morning.      hydrochlorothiazide (HYDRODIURIL) 25 MG tablet Take 25 mg by mouth every morning.      Multiple Vitamin (MULTIVITAMIN PO) Take by mouth.     nystatin-triamcinolone (MYCOLOG II) cream APPLY TO AFFECTED AREA TWICE A DAY 30 g 0   SYNTHROID 150 MCG tablet Take 150 mcg by mouth daily.     tretinoin (RETIN-A) 0.025 % cream Apply topically at bedtime as needed.     tretinoin (RETIN-A) 0.05 % cream SMARTSIG:sparingly Topical Every Night PRN     No current facility-administered medications for this visit.     ALLERGIES: Doxycycline hyclate  Family History  Problem Relation Age of Onset   Hypertension Mother    Other Mother        voice box cancer   Hypertension Father    Heart disease Father        stints   Hypertension Sister    Diabetes  Sister    Breast cancer Sister    Thyroid cancer Sister    Hypertension Brother    Lung cancer Maternal Grandmother    Heart attack Paternal Grandfather        died at 66    Social History   Socioeconomic History   Marital status: Single    Spouse name: Not on file   Number of children: Not on file   Years of education: Not on file   Highest education level: Not on file  Occupational History   Not on file  Tobacco Use   Smoking status: Never   Smokeless tobacco: Never  Vaping Use   Vaping status: Never Used  Substance and Sexual Activity   Alcohol use: Yes    Comment: occasional   Drug use: No   Sexual activity: Not Currently    Partners: Male    Birth control/protection: Abstinence    Comment: 1st intercourse- 68, partners- 4,   Other Topics Concern  Not on file  Social History Narrative   Not on file   Social Determinants of Health   Financial Resource Strain: Not on file  Food Insecurity: Not on file  Transportation Needs: Not on file  Physical Activity: Not on file  Stress: Not on file  Social Connections: Not on file  Intimate Partner Violence: Not on file    Review of Systems  PHYSICAL EXAMINATION:    There were no vitals taken for this visit.    General appearance: alert, cooperative and appears stated age Head: Normocephalic, without obvious abnormality, atraumatic Neck: no adenopathy, supple, symmetrical, trachea midline and thyroid normal to inspection and palpation Lungs: clear to auscultation bilaterally Breasts: normal appearance, no masses or tenderness, No nipple retraction or dimpling, No nipple discharge or bleeding, No axillary or supraclavicular adenopathy Heart: regular rate and rhythm Abdomen: soft, non-tender, no masses,  no organomegaly Extremities: extremities normal, atraumatic, no cyanosis or edema Skin: Skin color, texture, turgor normal. No rashes or lesions Lymph nodes: Cervical, supraclavicular, and axillary nodes  normal. No abnormal inguinal nodes palpated Neurologic: Grossly normal  Pelvic: External genitalia:  no lesions              Urethra:  normal appearing urethra with no masses, tenderness or lesions              Bartholins and Skenes: normal                 Vagina: normal appearing vagina with normal color and discharge, no lesions              Cervix: no lesions                Bimanual Exam:  Uterus:  normal size, contour, position, consistency, mobility, non-tender              Adnexa: no mass, fullness, tenderness              Rectal exam: {yes no:[id number]}.  Confirms.              Anus:  normal sphincter tone, no lesions  Chaperone was present for exam:  ***  ASSESSMENT     PLAN     An After Visit Summary was printed and given to the patient.  ______ minutes face to face time of which over 50% was spent in counseling.

## 2023-03-26 ENCOUNTER — Ambulatory Visit: Payer: No Typology Code available for payment source | Admitting: Obstetrics and Gynecology

## 2023-04-09 NOTE — Progress Notes (Signed)
GYNECOLOGY  VISIT   HPI: 52 y.o.   Single  African American  female   [phone] with No LMP recorded. Patient is perimenopausal.   here for   HRT consult.  LMP was at least 6 - 7 months ago.  States her memory is not as clear.  This is her greatest concern.   No night sweats.  Generally cold natured due to anemia.   Works out 4 days a week.  Feels like she does not bounce back as quickly. Some soreness.  Will start magnesium per her PCP.  reports emotional sensitivity.   Not sexually active.  FSH 64.2, E2 17. Testosterone total 17 and free testosterone 1.6.   Studying for paralegal certification at work.   GYNECOLOGIC HISTORY: No LMP recorded. Patient is perimenopausal. Contraception:  perimenopause Menopausal hormone therapy:  n/a Last mammogram:  11/16/22 Breast Density Cat B, BI-RADS CAT 2 benign Last pap smear:   02/27/23 neg, 01/27/22 neg: HR HPV neg        OB History     Gravida  3   Para  2   Term  2   Preterm      AB  1   Living  2      SAB  1   IAB      Ectopic      Multiple      Live Births                 Patient Active Problem List   Diagnosis Date Noted   Multinodular goiter (nontoxic) 01/28/2013    Past Medical History:  Diagnosis Date   Goiter    Hypertension     Past Surgical History:  Procedure Laterality Date   NO PAST SURGERIES     THYROIDECTOMY N/A 06/06/2013   Procedure: TOTAL THYROIDECTOMY;  Surgeon: Velora Heckler, MD;  Location: [facility];  Service: General;  Laterality: N/A;   UPPER GI ENDOSCOPY  03/2020    Current Outpatient Medications  Medication Sig Dispense Refill   amLODipine (NORVASC) 5 MG tablet Take 5 mg by mouth every morning.      hydrochlorothiazide (HYDRODIURIL) 25 MG tablet Take 25 mg by mouth every morning.      Multiple Vitamin (MULTIVITAMIN PO) Take by mouth.     Multiple Vitamins-Minerals (MULTI FOR HER) TABS 1 tablet Orally once a day     nystatin-triamcinolone (MYCOLOG II) cream APPLY TO  AFFECTED AREA TWICE A DAY 30 g 0   SYNTHROID 150 MCG tablet Take 150 mcg by mouth daily.     tretinoin (RETIN-A) 0.025 % cream Apply topically at bedtime as needed.     tretinoin (RETIN-A) 0.05 % cream SMARTSIG:sparingly Topical Every Night PRN     No current facility-administered medications for this visit.     ALLERGIES: Doxycycline hyclate  Family History  Problem Relation Age of Onset   Hypertension Mother    Other Mother        voice box cancer   Hypertension Father    Heart disease Father        stints   Hypertension Sister    Diabetes Sister    Breast cancer Sister    Thyroid cancer Sister    Hypertension Brother    Lung cancer Maternal Grandmother    Heart attack Paternal Grandfather        died at 82    Social History   Socioeconomic History   Marital status: Single    Spouse  name: Not on file   Number of children: Not on file   Years of education: Not on file   Highest education level: Not on file  Occupational History   Not on file  Tobacco Use   Smoking status: Never   Smokeless tobacco: Never  Vaping Use   Vaping status: Never Used  Substance and Sexual Activity   Alcohol use: Yes    Comment: occasional   Drug use: No   Sexual activity: Not Currently    Partners: Male    Birth control/protection: Abstinence    Comment: 1st intercourse- 52, partners- 4,   Other Topics Concern   Not on file  Social History Narrative   Not on file   Social Determinants of Health   Financial Resource Strain: Not on file  Food Insecurity: Not on file  Transportation Needs: Not on file  Physical Activity: Not on file  Stress: Not on file  Social Connections: Not on file  Intimate Partner Violence: Not on file    Review of Systems  All other systems reviewed and are negative.   PHYSICAL EXAMINATION:    BP 116/70 (BP Location: Right Arm, Patient Position: Sitting, Cuff Size: Normal)   Pulse (!) 49   Wt 203 lb (92.1 kg)   SpO2 97%   BMI 33.01 kg/m      General appearance: alert, cooperative and appears stated age  ASSESSMENT  Perimenopause. FH heart disease.  FH breast cancer.   PLAN  We discussed perimenopause and wide fluctuations of hormone levels and symptoms associated with this.  Tx options reviewed:  HRT, Paxil/Effexor, Neurontin, Veozah.  We focused on HRT and potential risks and benefits.  Discused WHI and use of HRT which can increase risk of PE, DVT, MI, stroke and breast cancer.  At this time, she would like to do observational management and follow up as needed.   33 min  total time was spent for this patient encounter, including preparation, face-to-face counseling with the patient, coordination of care, and documentation of the encounter.

## 2023-04-11 ENCOUNTER — Encounter: Payer: Self-pay | Admitting: Obstetrics and Gynecology

## 2023-04-23 ENCOUNTER — Ambulatory Visit: Payer: No Typology Code available for payment source | Admitting: Obstetrics and Gynecology

## 2023-04-23 ENCOUNTER — Encounter: Payer: Self-pay | Admitting: Obstetrics and Gynecology

## 2023-04-23 VITALS — BP 116/70 | HR 49 | Wt 203.0 lb

## 2023-04-23 DIAGNOSIS — N951 Menopausal and female climacteric states: Secondary | ICD-10-CM

## 2023-04-23 NOTE — Patient Instructions (Signed)
Menopause and Hormone Replacement Therapy Menopause is a normal time of life when menstrual periods stop completely and the ovaries stop producing the female hormones estrogen and progesterone. Low levels of these hormones can affect your health and cause symptoms. Hormone replacement therapy (HRT) can relieve some of those symptoms. HRT is the use of artificial (synthetic) hormones to replace hormones that your body has stopped producing because you have reached menopause. Types of HRT  HRT may consist of the synthetic hormones estrogen and progestin, or it may consist of estrogen-only therapy. You and your health care provider will decide which form of HRT is best for you. If you choose to be on HRT and you have a uterus, estrogen and progestin are usually prescribed. Estrogen-only therapy is used for women who do not have a uterus. Possible options for taking HRT include: Pills. Patches. Gels. Sprays. Vaginal cream. Vaginal rings. Vaginal inserts. The amount of hormones that you take and how long you take them varies according to your health. It is important to: Begin HRT with the lowest possible dosage. Stop HRT as soon as your health care provider tells you to stop. Work with your health care provider so that you feel informed and comfortable with your decisions. Tell a health care provider about: Any allergies you have. Whether you have had blood clots or know of any risk factors you may have for blood clots. Whether you or family members have had cancer, especially cancer of the breasts, ovaries, or uterus. Any surgeries you have had. All medicines you are taking, including vitamins, herbs, eye drops, creams, and over-the-counter medicines. Whether you are pregnant or may be pregnant. Any medical conditions you have. What are the benefits? HRT can reduce the frequency and severity of menopausal symptoms. Benefits of HRT vary according to the kind of symptoms that you have, how  severe they are, and your overall health. HRT may help to improve the following symptoms of menopause: Hot flashes and night sweats. These are sudden feelings of heat that spread over the face and body. The skin may turn red, like a blush. Night sweats are hot flashes that happen while you are sleeping or trying to sleep. Bone loss (osteoporosis). The body loses calcium more quickly after menopause, causing the bones to become weaker. This can increase the risk for bone breaks (fractures). Vaginal dryness. The lining of the vagina can become thin and dry, which can cause pain during sex or cause infection, burning, or itching. Urinary tract infections. Urinary incontinence. This is the inability to control when you urinate. Irritability. Short-term memory problems. What are the risks? Risks of HRT vary depending on your individual health and medical history. Risks of HRT also depend on whether you receive both estrogen and progestin or you receive estrogen only. HRT may increase the risk of: Spotting. This is when a small amount of blood leaks from the vagina unexpectedly. Endometrial cancer. This cancer is in the lining of the uterus (endometrium). Breast cancer. Increased density of breast tissue. This can make it harder to find breast cancer on a breast X-ray (mammogram). Stroke. Heart disease. Blood clots. Gallbladder disease or liver disease. Risks of HRT can increase if you have any of the following conditions: Endometrial cancer. Liver disease. Heart disease. Breast cancer. History of blood clots. History of stroke. Follow these instructions at home: Pap tests Have Pap tests done as often as told by your health care provider. A Pap test is sometimes called a Pap smear. It is  a screening test that is used to check for signs of cancer of the cervix and vagina. A Pap test can also identify the presence of infection or precancerous changes. Pap tests may be done: Every 3 years,  starting at age 47. Every 5 years, starting after age 34, in combination with testing for human papillomavirus (HPV). More often or less often depending on other medical conditions you have, your age, and other risk factors. It is up to you to get the results of your Pap test. Ask your health care provider, or the department that is doing the test, when your results will be ready. General instructions Take over-the-counter and prescription medicines only as told by your health care provider. Do not use any products that contain nicotine or tobacco. These products include cigarettes, chewing tobacco, and vaping devices, such as e-cigarettes. If you need help quitting, ask your health care provider. Get mammograms, pelvic exams, and medical checkups as often as told by your health care provider. Keep all follow-up visits. This is important. Contact a health care provider if you have: Pain or swelling in your legs. Lumps or changes in your breasts or armpits. Pain, burning, or bleeding when you urinate. Unusual vaginal bleeding. Dizziness or headaches. Pain in your abdomen. Get help right away if you have: Shortness of breath. Chest pain. Slurred speech. Weakness or numbness in any part of your arms or legs. These symptoms may represent a serious problem that is an emergency. Do not wait to see if the symptoms will go away. Get medical help right away. Call your local emergency services (911 in the [place] Do not drive yourself to the hospital. Summary Menopause is a normal time of life when menstrual periods stop completely and the ovaries stop producing the female hormones estrogen and progesterone. HRT can reduce the frequency and severity of menopausal symptoms. Risks of HRT vary depending on your individual health and medical history. This information is not intended to replace advice given to you by your health care provider. Make sure you discuss any questions you have with your health  care provider. Document Revised: 12/08/2019 Document Reviewed: 12/08/2019 Elsevier Patient Education  2024 Else[employer] Inc.   Perimenopause Perimenopause is the normal time of a woman's life when the levels of estrogen, the female hormone produced by the ovaries, begin to decrease. This leads to changes in menstrual periods before they stop completely (menopause). Perimenopause can begin 2-8 years before menopause. During perimenopause, the ovaries may or may not produce an egg and a woman can still become pregnant. What are the causes? This condition is caused by a natural change in hormone levels that happens as you get older. What increases the risk? This condition is more likely to start at an earlier age if you have certain medical conditions or have undergone treatments, including: A tumor of the pituitary gland in the brain. A disease that affects the ovaries and hormone production. Certain cancer treatments, such as chemotherapy or hormone therapy, or radiation therapy on the pelvis. Heavy smoking and excessive alcohol use. Family history of early menopause. What are the signs or symptoms? Perimenopausal changes affect each woman differently. Symptoms of this condition may include: Hot flashes. Irregular menstrual periods. Night sweats. Changes in feelings about sex. This could be a decrease in sex drive or an increased discomfort around your sexuality. Vaginal dryness. Headaches. Mood swings. Depression. Problems sleeping (insomnia). Memory problems or trouble concentrating. Irritability. Tiredness. Weight gain. Anxiety. Trouble getting pregnant. How is this diagnosed?  This condition is diagnosed based on your medical history, a physical exam, your age, your menstrual history, and your symptoms. Hormone tests may also be done. How is this treated? In some cases, no treatment is needed. You and your health care provider should make a decision together about whether treatment  is necessary. Treatment will be based on your individual condition and preferences. Various treatments are available, such as: Menopausal hormone therapy (MHT). Medicines to treat specific symptoms. Acupuncture. Vitamin or herbal supplements. Before starting treatment, make sure to let your health care provider know if you have a personal or family history of: Heart disease. Breast cancer. Blood clots. Diabetes. Osteoporosis. Follow these instructions at home: Medicines Take over-the-counter and prescription medicines only as told by your health care provider. Take vitamin supplements only as told by your health care provider. Talk with your health care provider before starting any herbal supplements. Lifestyle  Do not use any products that contain nicotine or tobacco, such as cigarettes, e-cigarettes, and chewing tobacco. If you need help quitting, ask your health care provider. Get at least 30 minutes of physical activity on 5 or more days each week. Eat a balanced diet that includes fresh fruits and vegetables, whole grains, soybeans, eggs, lean meat, and low-fat dairy. Avoid alcoholic and caffeinated beverages, as well as spicy foods. This may help prevent hot flashes. Get 7-8 hours of sleep each night. Dress in layers that can be removed to help you manage hot flashes. Find ways to manage stress, such as deep breathing, meditation, or journaling. General instructions  Keep track of your menstrual periods, including: When they occur. How heavy they are and how long they last. How much time passes between periods. Keep track of your symptoms, noting when they start, how often you have them, and how long they last. Use vaginal lubricants or moisturizers to help with vaginal dryness and improve comfort during sex. You can still become pregnant if you are having irregular periods. Make sure you use contraception during perimenopause if you do not want to get pregnant. Keep all  follow-up visits. This is important. This includes any group therapy or counseling. Contact a health care provider if: You have heavy vaginal bleeding or pass blood clots. Your period lasts more than 2 days longer than normal. Your periods are recurring sooner than 21 days. You bleed after having sex. You have pain during sex. Get help right away if you have: Chest pain, trouble breathing, or trouble talking. Severe depression. Pain when you urinate. Severe headaches. Vision problems. Summary Perimenopause is the time when a woman's body begins to move into menopause. This may happen naturally or as a result of other health problems or medical treatments. Perimenopause can begin 2-8 years before menopause, and it can last for several years. Perimenopausal symptoms can be managed through medicines, lifestyle changes, and complementary therapies such as acupuncture. This information is not intended to replace advice given to you by your health care provider. Make sure you discuss any questions you have with your health care provider. Document Revised: 11/20/2019 Document Reviewed: 11/20/2019 Elsevier Patient Education  2024 [employer].

## 2023-06-04 ENCOUNTER — Other Ambulatory Visit: Payer: Self-pay

## 2023-06-04 MED ORDER — NYSTATIN-TRIAMCINOLONE 100000-0.1 UNIT/GM-% EX CREA: TOPICAL_CREAM | Freq: Two times a day (BID) | CUTANEOUS | 0 refills | Status: AC

## 2023-06-04 NOTE — Telephone Encounter (Signed)
Medication refill request: nystatin-triamcinolone cream Last AEX:  02-27-23 Next AEX: none Last MMG (if hormonal medication request): n/a Refill authorized: patient states she uses this cream under her breast. Please approve if appropriate

## 2023-08-07 ENCOUNTER — Encounter (HOSPITAL_BASED_OUTPATIENT_CLINIC_OR_DEPARTMENT_OTHER): Payer: Self-pay

## 2024-02-07 ENCOUNTER — Encounter (HOSPITAL_BASED_OUTPATIENT_CLINIC_OR_DEPARTMENT_OTHER): Payer: Self-pay

## 2024-03-27 ENCOUNTER — Ambulatory Visit (HOSPITAL_BASED_OUTPATIENT_CLINIC_OR_DEPARTMENT_OTHER): Admitting: Nurse Practitioner

## 2024-03-27 ENCOUNTER — Encounter (HOSPITAL_BASED_OUTPATIENT_CLINIC_OR_DEPARTMENT_OTHER): Payer: Self-pay | Admitting: Nurse Practitioner

## 2024-03-27 VITALS — BP 110/84 | HR 47 | Ht 65.0 in | Wt 202.0 lb

## 2024-03-27 DIAGNOSIS — I10 Essential (primary) hypertension: Secondary | ICD-10-CM

## 2024-03-27 DIAGNOSIS — E785 Hyperlipidemia, unspecified: Secondary | ICD-10-CM | POA: Diagnosis not present

## 2024-03-27 DIAGNOSIS — Z8249 Family history of ischemic heart disease and other diseases of the circulatory system: Secondary | ICD-10-CM

## 2024-03-27 DIAGNOSIS — E66811 Obesity, class 1: Secondary | ICD-10-CM

## 2024-03-27 DIAGNOSIS — Z7189 Other specified counseling: Secondary | ICD-10-CM | POA: Diagnosis not present

## 2024-03-27 DIAGNOSIS — R001 Bradycardia, unspecified: Secondary | ICD-10-CM | POA: Diagnosis not present

## 2024-03-27 NOTE — Patient Instructions (Signed)
 Medication Instructions:   Your physician recommends that you continue on your current medications as directed. Please refer to the Current Medication list given to you today.   *If you need a refill on your cardiac medications before your next appointment, please call your pharmacy*  Lab Work:  TODAY!!!! [facility]  If you have labs (blood work) drawn today and your tests are completely normal, you will receive your results only by: MyChart Message (if you have MyChart) OR A paper copy in the mail If you have any lab test that is abnormal or we need to change your treatment, we will call you to review the results.  Testing/Procedures:  [name] has ordered a CT coronary calcium  score.   Test locations:   [facility].   This is $99 out of pocket.   Coronary CalciumScan A coronary calcium  scan is an imaging test used to look for deposits of calcium  and other fatty materials (plaques) in the inner lining of the blood vessels of the heart (coronary arteries). These deposits of calcium  and plaques can partly clog and narrow the coronary arteries without producing any symptoms or warning signs. This puts a person at risk for a heart attack. This test can detect these deposits before symptoms develop. Tell a health care provider about: Any allergies you have. All medicines you are taking, including vitamins, herbs, eye drops, creams, and over-the-counter medicines. Any problems you or family members have had with anesthetic medicines. Any blood disorders you have. Any surgeries you have had. Any medical conditions you have. Whether you are pregnant or may be pregnant. What are the risks? Generally, this is a safe procedure. However, problems may occur, including: Harm to a pregnant woman and her unborn baby. This test involves the use of radiation. Radiation exposure can be dangerous to a pregnant woman and her unborn baby. If you are pregnant, you generally should not  have this procedure done. Slight increase in the risk of cancer. This is because of the radiation involved in the test. What happens before the procedure? No preparation is needed for this procedure. What happens during the procedure? You will undress and remove any jewelry around your neck or chest. You will put on a hospital gown. Sticky electrodes will be placed on your chest. The electrodes will be connected to an electrocardiogram (ECG) machine to record a tracing of the electrical activity of your heart. A CT scanner will take pictures of your heart. During this time, you will be asked to lie still and hold your breath for 2-3 seconds while a picture of your heart is being taken. The procedure may vary among health care providers and hospitals. What happens after the procedure? You can get dressed. You can return to your normal activities. It is up to you to get the results of your test. Ask your health care provider, or the department that is doing the test, when your results will be ready. Summary A coronary calcium  scan is an imaging test used to look for deposits of calcium  and other fatty materials (plaques) in the inner lining of the blood vessels of the heart (coronary arteries). Generally, this is a safe procedure. Tell your health care provider if you are pregnant or may be pregnant. No preparation is needed for this procedure. A CT scanner will take pictures of your heart. You can return to your normal activities after the scan is done. This information is not intended to replace advice given to you by your health  care provider. Make sure you discuss any questions you have with your health care provider. Document Released: 12/02/2007 Document Revised: 04/24/2016 Document Reviewed: 04/24/2016 Elsevier Interactive Patient Education  2017 [employer].   Follow-Up: At [facility], you and your health needs are our priority.  As part of our continuing mission to  provide you with exceptional heart care, our providers are all part of one team.  This team includes your primary Cardiologist (physician) and Advanced Practice Providers or APPs (Physician Assistants and Nurse Practitioners) who all work together to provide you with the care you need, when you need it.  Your next appointment:    Will be determined upon test results.   Provider:   Rosaline Bane, NP    We recommend signing up for the patient portal called MyChart.  Sign up information is provided on this After Visit Summary.  MyChart is used to connect with patients for Virtual Visits (Telemedicine).  Patients are able to view lab/test results, encounter notes, upcoming appointments, etc.  Non-urgent messages can be sent to your provider as well.   To learn more about what you can do with MyChart, go to [email or web address].

## 2024-03-27 NOTE — Progress Notes (Signed)
 Cardiology Office Note:  .   Date:  03/27/2024 ID:  [name], DOB 22-Mar-1971, MRN [id number] PCP: Okey Carlin Redbird, MD [facility] Providers Cardiologist:  None   Patient Profile: .      PMH Family history heart disease Hypertension Hypothyroidism S/p total thyroidectomy 2014 Prediabetes Hyperlipidemia       History of Present Illness: .   Discussed the use of AI scribe software for clinical note transcription with the patient, who gave verbal consent to proceed.  History of Present Illness [name] is a very pleasant 53 year old female who presents for a cardiovascular evaluation due to a family history of cardiovascular disease. Family history of cardiovascular disease, including strokes in her father and uncles, and a grandfather who died at 50 from a heart attack. Both parents have had stents placed for peripheral arterial disease. Her sister's diabetes raises her concern about her own risk factors. She maintains an active lifestyle, participating in boot camp four days a week and walking on other days. Her weight is 202 pounds, and she aims to reduce it to avoid diabetes. She follows a 1300 calorie diet, focusing on protein and reducing sweets. Her A1c is 5.7, and she aims to lower it. Her LDL cholesterol was 106 in July, improved from 123 in January. Heart rate today is 45 bpm. She denies lightheadedness or dizziness, except during vertigo episodes.  Dizziness is associated with nausea felt to be vertigo by PCP and symptoms are triggered by bright lights and stress. She has been given accommodation to work from home given the difficulty with overhead lighting at work managed by controlling lighting at home.  She denies chest pain, shortness of breath, orthopnea, PND, edema, palpitations, presyncope or syncope.  Family history: Her family history includes Breast cancer in her sister; Diabetes in her sister; Heart attack in her paternal grandfather; Heart disease in  her father; Hypertension in her brother, father, mother, and sister; Lung cancer in her maternal grandmother; Other in her mother; Stroke in her paternal grandfather; Stroke (age of onset: 55) in her father; Thyroid  cancer in her sister.   Discussed the use of AI scribe software for clinical note transcription with the patient, who gave verbal consent to proceed.  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 Walking on other days  No results found for: LIPOA    ROS: See HPI       Studies Reviewed: SABRA   EKG Interpretation Date/Time:  Thursday March 27 2024 09:25:23 EDT Ventricular Rate:  45 PR Interval:  204 QRS Duration:  74 QT Interval:  442 QTC Calculation: 382 R Axis:   13  Text Interpretation: Sinus bradycardia When compared with ECG of 04-Jun-2013 09:31, Criteria for Inferior infarct are no longer Present Confirmed by Percy Browning [phone]) on 03/27/2024 9:51:36 AM      Risk Assessment/Calculations:             Physical Exam:   VS: BP 110/84   Pulse (!) 47   Ht 5' 5 (1.651 m)   Wt 202 lb (91.6 kg)   SpO2 96%   BMI 33.61 kg/m   Wt Readings from Last 3 Encounters:  03/27/24 202 lb (91.6 kg)  04/23/23 203 lb (92.1 kg)  02/27/23 203 lb (92.1 kg)  GEN: Well nourished, well developed in no acute distress NECK: No JVD; No carotid bruits CARDIAC: RRR, no murmurs, rubs, gallops RESPIRATORY:  Clear to auscultation without rales, wheezing or rhonchi  ABDOMEN: Soft, non-tender, non-distended EXTREMITIES:  No edema; No deformity    Assessment & Plan Sinus bradycardia   EKG today reveals sinus bradycardia at 45 bpm.  No prior EKG for comparison.  We reviewed symptoms that would be concerning and she is asymptomatic.  She notes appropriate elevation in HR with exercise.  We discussed indications when  bradycardia would be concerning.   - Continue to monitor clinically  Hyperlipidemia   Lipid panel completed 01/14/2024 with total cholesterol 168, HDL 46, triglycerides 85, and LDL 106.  She improved LDL from 123 mg/dL in January 2025 with diet. Discussed LDL management and potential aggressive treatment, including indications for LDL goal 70 or lower if CAD is identified.  Goal for LDL if calcium  score is 0 and LP (a) is not elevated will be < 100. Explained roles of HDL and LDL.  -CT calcium  score for risk stratification -Check LP(a) for risk stratification -Consider lipid-lowering therapy if CAD is identified for goal LDL 70 or lower  Cardiac Risk Family history ASCVD Obesity Significant family history of ASCVD.  She is very active with regular HIT workouts along with walking.  She is working on improving her diet to decrease sugar intake and is currently on a 1300-calorie diet with the guidance of a nutritionist.  Her goal weight is less than 200 lb and to be below obesity classification by body scan/BMI. A1c 5.7% on 01/14/2024.  Goal is 5.6% or lower.  We discussed the role of LP(a) in cardiovascular risk and discussed screening through CT calcium  score as a preventative measure. -Get CT calcium  score for risk stratification -Check LP(a) - Lipid goals as noted above - Continue weight loss efforts with focus on heart healthy, high-fiber, high-protein diet avoiding processed foods, saturated fat, sugar, and other simple carbohydrates -Continue regular physical activity aiming for at least 150 minutes of moderate intensity exercise each week  Hypertension BP is well controlled.  Renal function stable on labs completed 01/14/2024.  No change in antihypertensive therapy today. - Continue amlodipine        Dispo: TBD  Signed, Rosaline Bane, NP-C

## 2024-03-28 ENCOUNTER — Ambulatory Visit: Payer: Self-pay | Admitting: Nurse Practitioner

## 2024-03-28 LAB — LIPOPROTEIN A (LPA): Lipoprotein (a): 206.1 nmol/L — ABNORMAL HIGH (ref <75.0)

## 2024-04-10 ENCOUNTER — Ambulatory Visit (HOSPITAL_BASED_OUTPATIENT_CLINIC_OR_DEPARTMENT_OTHER)
Admission: RE | Admit: 2024-04-10 | Discharge: 2024-04-10 | Disposition: A | Discharge status: Home / Self Care | Payer: Self-pay | Source: Ambulatory Visit | Attending: Nurse Practitioner | Admitting: Nurse Practitioner

## 2024-04-10 DIAGNOSIS — Z8249 Family history of ischemic heart disease and other diseases of the circulatory system: Secondary | ICD-10-CM | POA: Insufficient documentation

## 2024-04-15 LAB — HM MAMMOGRAPHY

## 2024-04-16 ENCOUNTER — Ambulatory Visit: Payer: Self-pay | Admitting: Obstetrics and Gynecology
